# Patient Record
Sex: Female | Born: 1995 | Race: Black or African American | Hispanic: No | Marital: Single | Smoking: Never smoker
Health system: Southern US, Community
[De-identification: ages and names within clinical notes are randomized; demographics above are authoritative.]

---
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
## 2016-06-06 ENCOUNTER — Encounter: Payer: Self-pay | Admitting: Emergency Medicine

## 2016-06-06 DIAGNOSIS — F1210 Cannabis abuse, uncomplicated: Secondary | ICD-10-CM | POA: Insufficient documentation

## 2016-06-06 DIAGNOSIS — R51 Headache: Secondary | ICD-10-CM | POA: Insufficient documentation

## 2016-06-06 NOTE — ED Notes (Signed)
Pt presents to ED with possible migraine headache. +sensitivity to light. No hx of the same. Denies vomiting.

## 2016-06-07 ENCOUNTER — Emergency Department
Admission: EM | Admit: 2016-06-07 | Discharge: 2016-06-07 | Disposition: A | Discharge status: Home / Self Care | Payer: BLUE CROSS/BLUE SHIELD | Attending: Emergency Medicine | Admitting: Emergency Medicine

## 2016-06-07 DIAGNOSIS — R51 Headache: Secondary | ICD-10-CM | POA: Diagnosis not present

## 2016-06-07 DIAGNOSIS — R519 Headache, unspecified: Secondary | ICD-10-CM

## 2016-06-07 LAB — POCT PREGNANCY, URINE: Preg Test, Ur: NEGATIVE

## 2016-06-07 MED ORDER — OXYCODONE-ACETAMINOPHEN 5-325 MG PO TABS
1.0000 | ORAL_TABLET | Freq: Once | ORAL | Status: AC
  Administered 2016-06-07: 1 via ORAL

## 2016-06-07 MED ORDER — OXYCODONE-ACETAMINOPHEN 5-325 MG PO TABS
ORAL_TABLET | ORAL | Status: AC
  Administered 2016-06-07: 1 via ORAL
  Filled 2016-06-07: qty 1

## 2016-06-07 MED ORDER — AMOXICILLIN-POT CLAVULANATE 200-28.5 MG PO CHEW: 1.0000 | CHEWABLE_TABLET | Freq: Two times a day (BID) | ORAL | Status: AC

## 2016-06-07 NOTE — ED Provider Notes (Signed)
[facility] Emergency Department Provider Note  ____________________________________________  Time seen: 1:15 AM  I have reviewed the triage vital signs and the nursing notes.   HISTORY  Chief Complaint Headache     HPI Cynthia Sosa is a 20 y.o. female resents with frontal headache "possible migraine", accompanied by nausea but no vomiting 2 days. Patient denies any fever or neck stiffness. She does however admit to light sensitivity. Patient denies any weakness numbness gait instability or visual changes. Patient admits to a family history of migraine headaches sibling. Patient does admit to sinus congestion 2 days as well.     Past medical history None There are no active problems to display for this patient.   Past surgical history None  Current Outpatient Rx  Name  Route  Sig  Dispense  Refill  . amoxicillin-clavulanate (AUGMENTIN) 200-28.5 MG chewable tablet   Oral   Chew 1 tablet by mouth 2 (two) times daily.   20 tablet   0     Allergies No known drug allergies  No family history on file.  Social History Social History  Substance Use Topics  . Smoking status: Never Smoker   . Smokeless tobacco: Never Used  . Alcohol Use: No    Review of Systems  Constitutional: Negative for fever. Eyes: Negative for visual changes. ENT: Negative for sore throat. Cardiovascular: Negative for chest pain. Respiratory: Negative for shortness of breath. Gastrointestinal: Negative for abdominal pain, vomiting and diarrhea. Genitourinary: Negative for dysuria. Musculoskeletal: Negative for back pain. Skin: Negative for rash. Neurological: Positive for headache   10-point ROS otherwise negative.  ____________________________________________   PHYSICAL EXAM:  VITAL SIGNS: ED Triage Vitals  Enc Vitals Group     BP 06/06/16 2312 117/64 mmHg     Pulse Rate 06/06/16 2312 93     Resp 06/06/16 2312 18     Temp 06/06/16 2312 100.3 F  (37.9 C)     Temp Source 06/06/16 2312 Oral     SpO2 06/06/16 2312 100 %     Weight 06/06/16 2312 130 lb (58.968 kg)     Height 06/06/16 2312 5\' 7"  (1.702 m)     Head Cir --      Peak Flow --      Pain Score 06/06/16 2313 10     Pain Loc --      Pain Edu? --      Excl. in GC? --      Constitutional: Alert and oriented. Well appearing and in no distress. Eyes: Conjunctivae are normal. PERRL. Normal extraocular movements. ENT   Head: Normocephalic and atraumatic.   Nose: No congestion/rhinnorhea. Pain with palpation maxillary sinuses   Mouth/Throat: Mucous membranes are moist.   Neck: No stridor. Hematological/Lymphatic/Immunilogical: No cervical lymphadenopathy. Cardiovascular: Normal rate, regular rhythm. Normal and symmetric distal pulses are present in all extremities. No murmurs, rubs, or gallops. Respiratory: Normal respiratory effort without tachypnea nor retractions. Breath sounds are clear and equal bilaterally. No wheezes/rales/rhonchi. Gastrointestinal: Soft and nontender. No distention. There is no CVA tenderness. Genitourinary: deferred Musculoskeletal: Nontender with normal range of motion in all extremities. No joint effusions.  No lower extremity tenderness nor edema. Neurologic:  Normal speech and language. No gross focal neurologic deficits are appreciated. Speech is normal.  Skin:  Skin is warm, dry and intact. No rash noted. Psychiatric: Mood and affect are normal. Speech and behavior are normal. Patient exhibits appropriate insight and judgment.     INITIAL IMPRESSION / ASSESSMENT AND  PLAN / ED COURSE  Pertinent labs & imaging results that were available during my care of the patient were reviewed by me and considered in my medical decision making (see chart for details).  Patient given 1 tablet Percocet with resolution of pain. We'll prescribe Augmentin.  ____________________________________________   FINAL CLINICAL IMPRESSION(S) / ED  DIAGNOSES  Final diagnoses:  Sinus headache      Darci Currentandolph N Brown, MD 06/07/16 [phone]

## 2016-06-07 NOTE — ED Notes (Signed)
Brown, MD and Butch, RN at bedside at this time. 

## 2016-06-07 NOTE — ED Notes (Signed)
Pt reports headache for "a couple of days" - Pt reports nausea but denies vomiting - Is having light sensitivity - Denies blurred vision

## 2016-06-07 NOTE — Discharge Instructions (Signed)
Sinus Headache A sinus headache occurs when the paranasal sinuses become clogged or swollen. Paranasal sinuses are air pockets within the bones of the face. Sinus headaches can range from mild to severe. CAUSES A sinus headache can result from various conditions that affect the sinuses, such as:  Colds.  Sinus infections.  Allergies. SYMPTOMS The main symptom of this condition is a headache that may feel like pain or pressure in the face, forehead, ears, or upper teeth. People who have a sinus headache often have other symptoms, such as:  Congested or runny nose.  Fever.  Inability to smell. Weather changes can make symptoms worse. DIAGNOSIS This condition may be diagnosed based on:  A physical exam and medical history.  Imaging tests, such as a CT scan and MRI, to check for problems with the sinuses.  A specialist may look into the sinuses with a tool that has a camera (endoscopy). TREATMENT Treatment for this condition depends on the cause.  Sinus pain that is caused by a sinus infection may be treated with antibiotic medicine.  Sinus pain that is caused by allergies may be helped by allergy medicines (antihistamines) and medicated nasal sprays.  Sinus pain that is caused by congestion may be helped by flushing the nose and sinuses with saline solution. HOME CARE INSTRUCTIONS  Take medicines only as directed by your health care provider.  If you were prescribed an antibiotic medicine, finish all of it even if you start to feel better.  If you have congestion, use a nasal spray to help reduce pressure.  If directed, apply a warm, moist washcloth to your face to help relieve pain. SEEK MEDICAL CARE IF:  You have headaches more than one time each week.  You have sensitivity to light or sound.  You have a fever.  You feel sick to your stomach (nauseous) or you throw up (vomit).  Your headaches do not get better with treatment. Many people think that they have a  sinus headache when they actually have migraines or tension headaches. SEEK IMMEDIATE MEDICAL CARE IF:  You have vision problems.  You have sudden, severe pain in your face or head.  You have a seizure.  You are confused.  You have a stiff neck.   This information is not intended to replace advice given to you by your health care provider. Make sure you discuss any questions you have with your health care provider.   Document Released: 01/03/2005 Document Revised: 04/12/2015 Document Reviewed: 11/22/2014 Elsevier Interactive Patient Education 2016 [employer]  

## 2016-11-24 ENCOUNTER — Emergency Department: Payer: BLUE CROSS/BLUE SHIELD

## 2016-11-24 ENCOUNTER — Emergency Department
Admission: EM | Admit: 2016-11-24 | Discharge: 2016-11-24 | Disposition: A | Discharge status: Home / Self Care | Payer: BLUE CROSS/BLUE SHIELD | Attending: Emergency Medicine | Admitting: Emergency Medicine

## 2016-11-24 DIAGNOSIS — F1290 Cannabis use, unspecified, uncomplicated: Secondary | ICD-10-CM | POA: Insufficient documentation

## 2016-11-24 DIAGNOSIS — B373 Candidiasis of vulva and vagina: Secondary | ICD-10-CM | POA: Insufficient documentation

## 2016-11-24 DIAGNOSIS — N760 Acute vaginitis: Secondary | ICD-10-CM | POA: Diagnosis not present

## 2016-11-24 DIAGNOSIS — B9689 Other specified bacterial agents as the cause of diseases classified elsewhere: Secondary | ICD-10-CM

## 2016-11-24 DIAGNOSIS — Z79899 Other long term (current) drug therapy: Secondary | ICD-10-CM | POA: Insufficient documentation

## 2016-11-24 DIAGNOSIS — R109 Unspecified abdominal pain: Secondary | ICD-10-CM | POA: Diagnosis present

## 2016-11-24 DIAGNOSIS — B3731 Acute candidiasis of vulva and vagina: Secondary | ICD-10-CM

## 2016-11-24 LAB — BASIC METABOLIC PANEL
Anion gap: 3 — ABNORMAL LOW (ref 5–15)
BUN: 14 mg/dL (ref 6–20)
CALCIUM: 9 mg/dL (ref 8.9–10.3)
CO2: 27 mmol/L (ref 22–32)
CREATININE: 0.79 mg/dL (ref 0.44–1.00)
Chloride: 105 mmol/L (ref 101–111)
GFR calc Af Amer: >60 mL/min (ref >60)
GLUCOSE: 68 mg/dL (ref 65–99)
Potassium: 4 mmol/L (ref 3.5–5.1)
Sodium: 135 mmol/L (ref 135–145)

## 2016-11-24 LAB — URINALYSIS, COMPLETE (UACMP) WITH MICROSCOPIC
Bacteria, UA: NONE SEEN
Bilirubin Urine: NEGATIVE
GLUCOSE, UA: NEGATIVE mg/dL
Hgb urine dipstick: NEGATIVE
KETONES UR: NEGATIVE mg/dL
Nitrite: NEGATIVE
PH: 5 (ref 5.0–8.0)
Protein, ur: NEGATIVE mg/dL
SPECIFIC GRAVITY, URINE: 1.028 (ref 1.005–1.030)

## 2016-11-24 LAB — CBC
HCT: 36.3 % (ref 35.0–47.0)
Hemoglobin: 12.1 g/dL (ref 12.0–16.0)
MCH: 28.4 pg (ref 26.0–34.0)
MCHC: 33.2 g/dL (ref 32.0–36.0)
MCV: 85.5 fL (ref 80.0–100.0)
PLATELETS: 181 10*3/uL (ref 150–440)
RBC: 4.25 MIL/uL (ref 3.80–5.20)
RDW: 13.5 % (ref 11.5–14.5)
WBC: 6.5 10*3/uL (ref 3.6–11.0)

## 2016-11-24 LAB — CHLAMYDIA/NGC RT PCR (ARMC ONLY)
Chlamydia Tr: NOT DETECTED
N gonorrhoeae: NOT DETECTED

## 2016-11-24 LAB — WET PREP, GENITAL
SPERM: NONE SEEN
TRICH WET PREP: NONE SEEN

## 2016-11-24 LAB — POCT PREGNANCY, URINE: Preg Test, Ur: NEGATIVE

## 2016-11-24 MED ORDER — METRONIDAZOLE 500 MG PO TABS: 500.0000 mg | ORAL_TABLET | Freq: Two times a day (BID) | ORAL | 0 refills | Status: AC

## 2016-11-24 MED ORDER — FLUCONAZOLE 100 MG PO TABS
150.0000 mg | ORAL_TABLET | Freq: Once | ORAL | Status: AC
  Administered 2016-11-24: 150 mg via ORAL
  Filled 2016-11-24: qty 1

## 2016-11-24 MED ORDER — FLUCONAZOLE 150 MG PO TABS: 150.0000 mg | ORAL_TABLET | Freq: Once | ORAL | 0 refills | Status: DC | PRN

## 2016-11-24 MED ORDER — METRONIDAZOLE 500 MG PO TABS
500.0000 mg | ORAL_TABLET | Freq: Once | ORAL | Status: AC
  Administered 2016-11-24: 500 mg via ORAL
  Filled 2016-11-24: qty 1

## 2016-11-24 NOTE — ED Notes (Signed)
Pt verbalized understanding of discharge instructions. NAD at this time. 

## 2016-11-24 NOTE — ED Triage Notes (Signed)
Pt states that she was seen at school for having too much protein in her urine, pt states that she is having lower back pain, pt states that she has "white chunks in her urine" pt also states that she has irritation to her vaginal area, states recent diagnosis with chlamydia and wants to make sure she doesn't still have it

## 2016-11-24 NOTE — Discharge Instructions (Signed)
Take antibiotics as prescribed. You may repeat the dose of fluconazole after the antibiotics are done. Do not drink alcohol while taking Flagyl as it may cause an allergic reaction. Return to the emergency room if you have worsening left flank pain, urinary burning or pain, worsening vaginal discharge, fever, abdominal pain, or any other symptoms concerning to you.

## 2016-11-24 NOTE — ED Provider Notes (Signed)
[facility] Emergency Department Provider Note  ____________________________________________  Time seen: Approximately 12:57 PM  I have reviewed the triage vital signs and the nursing notes.   HISTORY  Chief Complaint Flank Pain   HPI Laquesha Lujean RaveM [place] is a 20 y.o. female no significant past medical history who presents for evaluation of left flank pain and vaginal discharge. Patient reports that she has had left-sided flank pain since October. The pain is throbbing, located in the left flank, nonradiating, mild, only present when she lays down. She has no pain at this time. She also has had vaginal discharge and irritation for about a week. She reports 2 episodes of chlamydia for which she was treated for with the last antibiotic course and October. She denies dysuria or hematuria, abdominal pain, fever or chills, history of kidney stones, chest pain or shortness of breath.  No past medical history on file.  There are no active problems to display for this patient.   No past surgical history on file.  Prior to Admission medications   Medication Sig Start Date End Date Taking? Authorizing Provider  cetirizine (ZYRTEC) 10 MG tablet Take 10 mg by mouth daily. 09/11/16  Yes Historical Provider, MD  PROAIR HFA 108 (90 Base) MCG/ACT inhaler Inhale 2 puffs into the lungs 4 (four) times daily as needed. 10/16/16  Yes Historical Provider, MD  fluconazole (DIFLUCAN) 150 MG tablet Take 1 tablet (150 mg total) by mouth once as needed (yeast infection). 11/24/16   Nita Sicklearolina Camaya Gannett, MD  metroNIDAZOLE (FLAGYL) 500 MG tablet Take 1 tablet (500 mg total) by mouth 2 (two) times daily. 11/24/16 12/01/16  Nita Sicklearolina Laray Corbit, MD    Allergies Patient has no known allergies.  No family history on file.  Social History Social History  Substance Use Topics  . Smoking status: Never Smoker  . Smokeless tobacco: Never Used  . Alcohol use No    Review of  Systems  Constitutional: Negative for fever. Eyes: Negative for visual changes. ENT: Negative for sore throat. Neck: No neck pain  Cardiovascular: Negative for chest pain. Respiratory: Negative for shortness of breath. Gastrointestinal: Negative for abdominal pain, vomiting or diarrhea. Genitourinary: Negative for dysuria. + vaginal discharge Musculoskeletal: Negative for back pain. + L flank pain Skin: Negative for rash. Neurological: Negative for headaches, weakness or numbness. Psych: No SI or HI  ____________________________________________   PHYSICAL EXAM:  VITAL SIGNS: ED Triage Vitals  Enc Vitals Group     BP 11/24/16 1034 96/71     Pulse Rate 11/24/16 1034 78     Resp 11/24/16 1034 18     Temp 11/24/16 1034 98.4 F (36.9 C)     Temp Source 11/24/16 1034 Oral     SpO2 11/24/16 1034 100 %     Weight 11/24/16 1037 135 lb (61.2 kg)     Height 11/24/16 1037 5\' 7"  (1.702 m)     Head Circumference --      Peak Flow --      Pain Score --      Pain Loc --      Pain Edu? --      Excl. in GC? --     Constitutional: Alert and oriented. Well appearing and in no apparent distress. HEENT:      Head: Normocephalic and atraumatic.         Eyes: Conjunctivae are normal. Sclera is non-icteric. EOMI. PERRL      Mouth/Throat: Mucous membranes are moist.  Neck: Supple with no signs of meningismus. Cardiovascular: Regular rate and rhythm. No murmurs, gallops, or rubs. 2+ symmetrical distal pulses are present in all extremities. No JVD. Respiratory: Normal respiratory effort. Lungs are clear to auscultation bilaterally. No wheezes, crackles, or rhonchi.  Gastrointestinal: Soft, non tender, and non distended with positive bowel sounds. No rebound or guarding. Genitourinary: No CVA tenderness. Pelvic exam: Normal external genitalia, no rashes or lesions. Normal cervical mucus. Os closed. No cervical motion tenderness.  No uterine or adnexal tenderness.   Musculoskeletal:  Nontender with normal range of motion in all extremities. No edema, cyanosis, or erythema of extremities. Neurologic: Normal speech and language. Face is symmetric. Moving all extremities. No gross focal neurologic deficits are appreciated. Skin: Skin is warm, dry and intact. No rash noted. Psychiatric: Mood and affect are normal. Speech and behavior are normal.  ____________________________________________   LABS (all labs ordered are listed, but only abnormal results are displayed)  Labs Reviewed  WET PREP, GENITAL - Abnormal; Notable for the following:       Result Value   Yeast Wet Prep HPF POC PRESENT (*)    Clue Cells Wet Prep HPF POC PRESENT (*)    WBC, Wet Prep HPF POC MODERATE (*)    All other components within normal limits  URINALYSIS, COMPLETE (UACMP) WITH MICROSCOPIC - Abnormal; Notable for the following:    Color, Urine YELLOW (*)    APPearance HAZY (*)    Leukocytes, UA TRACE (*)    Squamous Epithelial / LPF 6-30 (*)    All other components within normal limits  BASIC METABOLIC PANEL - Abnormal; Notable for the following:    Anion gap 3 (*)    All other components within normal limits  CHLAMYDIA/NGC RT PCR ([facility] ONLY)  CBC  POC URINE PREG, ED  POCT PREGNANCY, URINE   ____________________________________________  EKG  none ____________________________________________  RADIOLOGY  Renal US: No acute findings. No hydronephrosis. ____________________________________________   PROCEDURES  Procedure(s) performed: None Procedures Critical Care performed:  None ____________________________________________   INITIAL IMPRESSION / ASSESSMENT AND PLAN / ED COURSE  20 y.o. female no significant past medical history who presents for evaluation of left flank pain and vaginal discharge. Patient is well-appearing, in no distress, pelvic exam and no acute findings. No flank tenderness, abdominal exam with no tenderness. UA with no evidence of urinary tract  infection. Wet prep, gonorrhea and chlamydia are pending. Pregnancy test is negative. We'll send patient for a left renal ultrasound to evaluate for any evidence of hydronephrosis. Safe sex counseling provided. Patient has IUD Mirena for birth control.   Clinical Course as of Nov 24 1412  Sat Nov 24, 2016  1409 Patient found to have BV and yeast infection. She received a dose of fluconazole and will be discharged on Flagyl with a repeat dose of fluconazole if her symptoms recur after antibiotics. CBC, BMP, and renal ultrasound all within normal limits with no evidence of urinary tract infection. Patient be discharged home with close follow-up with PCP.  [CV]    Clinical Course User Index [CV] Nita Sicklearolina Kiyla Ringler, MD    Pertinent labs & imaging results that were available during my care of the patient were reviewed by me and considered in my medical decision making (see chart for details).    ____________________________________________   FINAL CLINICAL IMPRESSION(S) / ED DIAGNOSES  Final diagnoses:  Left flank pain  BV (bacterial vaginosis)  Yeast infection of the vagina      NEW MEDICATIONS STARTED  DURING THIS VISIT:  New Prescriptions   FLUCONAZOLE (DIFLUCAN) 150 MG TABLET    Take 1 tablet (150 mg total) by mouth once as needed (yeast infection).   METRONIDAZOLE (FLAGYL) 500 MG TABLET    Take 1 tablet (500 mg total) by mouth 2 (two) times daily.     Note:  This document was prepared using Dragon voice recognition software and may include unintentional dictation errors.    Nita Sickle, MD 11/24/16 [phone]

## 2017-09-06 HISTORY — PX: ACHILLES TENDON SURGERY: SHX542

## 2017-10-19 ENCOUNTER — Emergency Department
Admission: EM | Admit: 2017-10-19 | Discharge: 2017-10-19 | Disposition: A | Discharge status: Home / Self Care | Payer: BLUE CROSS/BLUE SHIELD | Attending: Emergency Medicine | Admitting: Emergency Medicine

## 2017-10-19 DIAGNOSIS — Z79899 Other long term (current) drug therapy: Secondary | ICD-10-CM | POA: Insufficient documentation

## 2017-10-19 DIAGNOSIS — M79672 Pain in left foot: Secondary | ICD-10-CM | POA: Diagnosis present

## 2017-10-19 NOTE — Discharge Instructions (Signed)
There is no evidence that you have caused a new or severe injury to your operative site.  Please continue caring for your leg and foot as you were instructed by your surgeon.  Keep a thin coating of bacitracin or other antibiotic ointment on the wound where it is oozing or bleeding slightly and cover it with a bandage or Band-Aid.  Call your surgeon to arrange a follow up appointment.  Return to the Emergency Department if you develop new or worsening symptoms that concern you.

## 2017-10-19 NOTE — ED Provider Notes (Signed)
[facility] Emergency Department Provider Note  ____________________________________________   First MD Initiated Contact with Patient 10/19/17 [phone]     (approximate)  I have reviewed the triage vital signs and the nursing notes.   HISTORY  Chief Complaint Post-op Problem (left foot)    HPI Cynthia Sosa is a 21 y.o. female who had left Achilles tendon surgery with a surgeon in [place] approximately 6 weeks ago.  She presents tonight because she is supposed to be nonweightbearing but she forgot and swelling her leg over the bed and put some weight on the foot before she remembered.  She reports that she did not feel a pop but she did have some pain.  Her stitches have been removed and the scabs just recently fell off and there was a little bit of bleeding at the operative site which scared her.  She felt like she should be evaluated to make sure there is no new injury.  She denies any swelling in the area, denies numbness and tingling, and has no pain increased over baseline.  Her onset of the incident was acute and the symptoms were mild.  History reviewed. No pertinent past medical history.  There are no active problems to display for this patient.   Past Surgical History:  Procedure Laterality Date  . ACHILLES TENDON SURGERY Left 09/06/2017    Prior to Admission medications   Medication Sig Start Date End Date Taking? Authorizing Provider  cetirizine (ZYRTEC) 10 MG tablet Take 10 mg by mouth daily. 09/11/16   [provider]  fluconazole (DIFLUCAN) 150 MG tablet Take 1 tablet (150 mg total) by mouth once as needed (yeast infection). 11/24/16   Nita SickleVeronese, [place], MD  PROAIR HFA 108 [phone](90 Base) MCG/ACT inhaler Inhale 2 puffs into the lungs 4 (four) times daily as needed. 10/16/16   [provider]    Allergies Patient has no known allergies.  No family history on file.  Social History Social History   Tobacco Use  . Smoking  status: Never Smoker  . Smokeless tobacco: Never Used  Substance Use Topics  . Alcohol use: No  . Drug use: Yes    Types: Marijuana    Review of Systems Constitutional: No fever/chills Cardiovascular: Denies chest pain. Respiratory: Denies shortness of breath. Gastrointestinal: No nausea, no vomiting.   Musculoskeletal: Some pain initially with weightbearing of the left foot.  No swelling. Integumentary: Negative for rash.  Normal bleeding at the operative site Neurological: Negative for headaches, focal weakness or numbness.   ____________________________________________   PHYSICAL EXAM:  VITAL SIGNS: ED Triage Vitals  Enc Vitals Group     BP 10/19/17 0223 132/74     Pulse Rate 10/19/17 0223 88     Resp 10/19/17 0223 16     Temp 10/19/17 0223 98.6 F (37 C)     Temp Source 10/19/17 0223 Oral     SpO2 10/19/17 0223 99 %     Weight 10/19/17 0223 60.3 kg (133 lb)     Height 10/19/17 0223 1.727 m (5\' 8" )     Head Circumference --      Peak Flow --      Pain Score 10/19/17 0232 10     Pain Loc --      Pain Edu? --      Excl. in GC? --     Constitutional: Alert and oriented. Well appearing and in no acute distress. Eyes: Conjunctivae are normal.  Head: Atraumatic. Cardiovascular: Normal rate,  regular rhythm. Good peripheral circulation.  Respiratory: Normal respiratory effort.  No retractions.  Musculoskeletal: No lower extremity edema. No gross deformities of extremities.  The patient has no swelling, hematoma, nor ecchymosis of the left lower leg.  She has some tenderness that she says is normal and distal leg.  There is a very small area covered with a Band-Aid that is the operative site of the Achilles tendon surgery that is oozing some blood, but there is no evidence of infection or clinically significant wound dehiscence Neurologic:  Normal speech and language. No gross focal neurologic deficits are appreciated.  Skin:  Skin is warm, dry and intact. No rash  noted. Psychiatric: Mood and affect are normal. Speech and behavior are normal.  ____________________________________________   LABS (all labs ordered are listed, but only abnormal results are displayed)  Labs Reviewed - No data to display ____________________________________________  EKG  None - EKG not ordered by ED physician ____________________________________________  RADIOLOGY   No results found.  ____________________________________________   PROCEDURES  Critical Care performed: No   Procedure(s) performed:   Procedures   ____________________________________________   INITIAL IMPRESSION / ASSESSMENT AND PLAN / ED COURSE  As part of my medical decision making, I reviewed the following data within the electronic MEDICAL RECORD NUMBER History obtained from family and Nursing notes reviewed and incorporated    The patient's evaluation is reassuring.  Differential diagnosis includes reinjury to her operative site, wound dehiscence, wound infection, internal bleeding/hematoma, neurovascular compromise, etc.  However the site is well-appearing with only a small amount of blood oozing from the operative site that recently lost its scab.  There is no evidence of any infection and there is no swelling, tenderness or ecchymosis to suggest internal hematoma/hemorrhage.  The patient is in no distress.  I provided reassurance and encouraged her to continue using the same precautions she is previously and call her surgeon for a follow-up appointment, but explained there is no evidence of acute or emergent injury at this time.  She and her mother are comfortable with the plan for outpatient follow-up.     ____________________________________________  FINAL CLINICAL IMPRESSION(S) / ED DIAGNOSES  Final diagnoses:  Left foot pain     MEDICATIONS GIVEN DURING THIS VISIT:  Medications - No data to display   ED Discharge Orders    None       Note:  This document was  prepared using Dragon voice recognition software and may include unintentional dictation errors.    Loleta RoseForbach, Delanee Xin, MD 10/19/17 [phone]

## 2017-10-19 NOTE — ED Triage Notes (Signed)
Patient had achilles tendon surgery left foot on 9/28. Patient not supposed to bear weight on foot until end of November. Patient stepped down on wrong foot (left) and tore stitches.  Bleeding noted to area in triage. Patient c/o 10 out of 10 pain

## 2017-10-19 NOTE — ED Notes (Signed)
Pt with open surgical incision noted to left lateral ankle. Pt with controlled bleeding.

## 2018-05-16 ENCOUNTER — Ambulatory Visit: Payer: Managed Care, Other (non HMO) | Admitting: Anesthesiology

## 2018-05-16 ENCOUNTER — Encounter: Payer: Self-pay | Admitting: *Deleted

## 2018-05-16 ENCOUNTER — Encounter
Admission: RE | Disposition: A | Discharge status: Home / Self Care | Payer: Self-pay | Source: Ambulatory Visit | Attending: Obstetrics & Gynecology

## 2018-05-16 ENCOUNTER — Ambulatory Visit
Admission: RE | Admit: 2018-05-16 | Discharge: 2018-05-16 | Disposition: A | Discharge status: Home / Self Care | Payer: Managed Care, Other (non HMO) | Source: Ambulatory Visit | Attending: Obstetrics & Gynecology | Admitting: Obstetrics & Gynecology

## 2018-05-16 ENCOUNTER — Other Ambulatory Visit: Payer: Self-pay

## 2018-05-16 DIAGNOSIS — Y929 Unspecified place or not applicable: Secondary | ICD-10-CM | POA: Diagnosis not present

## 2018-05-16 DIAGNOSIS — T8332XA Displacement of intrauterine contraceptive device, initial encounter: Secondary | ICD-10-CM | POA: Diagnosis present

## 2018-05-16 DIAGNOSIS — Y848 Other medical procedures as the cause of abnormal reaction of the patient, or of later complication, without mention of misadventure at the time of the procedure: Secondary | ICD-10-CM | POA: Insufficient documentation

## 2018-05-16 DIAGNOSIS — T839XXA Unspecified complication of genitourinary prosthetic device, implant and graft, initial encounter: Secondary | ICD-10-CM

## 2018-05-16 HISTORY — PX: IUD REMOVAL: SHX5392

## 2018-05-16 HISTORY — PX: HYSTEROSCOPY: SHX211

## 2018-05-16 LAB — URINE DRUG SCREEN, QUALITATIVE (ARMC ONLY)
AMPHETAMINES, UR SCREEN: NOT DETECTED
BARBITURATES, UR SCREEN: NOT DETECTED
Benzodiazepine, Ur Scrn: NOT DETECTED
Cannabinoid 50 Ng, Ur ~~LOC~~: POSITIVE — AB
Cocaine Metabolite,Ur ~~LOC~~: NOT DETECTED
MDMA (Ecstasy)Ur Screen: NOT DETECTED
Methadone Scn, Ur: NOT DETECTED
Opiate, Ur Screen: NOT DETECTED
Phencyclidine (PCP) Ur S: NOT DETECTED
Tricyclic, Ur Screen: NOT DETECTED

## 2018-05-16 LAB — BASIC METABOLIC PANEL
Anion gap: 7 (ref 5–15)
BUN: 12 mg/dL (ref 6–20)
CALCIUM: 9 mg/dL (ref 8.9–10.3)
CO2: 24 mmol/L (ref 22–32)
CREATININE: 0.8 mg/dL (ref 0.44–1.00)
Chloride: 107 mmol/L (ref 101–111)
GFR calc Af Amer: >60 mL/min (ref >60)
GLUCOSE: 85 mg/dL (ref 65–99)
POTASSIUM: 3.8 mmol/L (ref 3.5–5.1)
SODIUM: 138 mmol/L (ref 135–145)

## 2018-05-16 LAB — CBC
HCT: 36.3 % (ref 35.0–47.0)
Hemoglobin: 12 g/dL (ref 12.0–16.0)
MCH: 28.9 pg (ref 26.0–34.0)
MCHC: 33.1 g/dL (ref 32.0–36.0)
MCV: 87.3 fL (ref 80.0–100.0)
PLATELETS: 257 10*3/uL (ref 150–440)
RBC: 4.15 MIL/uL (ref 3.80–5.20)
RDW: 13.6 % (ref 11.5–14.5)
WBC: 6.7 10*3/uL (ref 3.6–11.0)

## 2018-05-16 LAB — POCT PREGNANCY, URINE: PREG TEST UR: NEGATIVE

## 2018-05-16 SURGERY — HYSTEROSCOPY
Anesthesia: Choice

## 2018-05-16 SURGERY — HYSTEROSCOPY
Anesthesia: General | Site: Uterus | Wound class: Clean Contaminated

## 2018-05-16 MED ORDER — PROPOFOL 10 MG/ML IV BOLUS
INTRAVENOUS | Status: DC | PRN
  Administered 2018-05-16: 150 mg via INTRAVENOUS

## 2018-05-16 MED ORDER — MIDAZOLAM HCL 2 MG/2ML IJ SOLN
INTRAMUSCULAR | Status: AC
  Filled 2018-05-16: qty 2

## 2018-05-16 MED ORDER — PROPOFOL 10 MG/ML IV BOLUS
INTRAVENOUS | Status: AC
  Filled 2018-05-16: qty 20

## 2018-05-16 MED ORDER — MIDAZOLAM HCL 2 MG/2ML IJ SOLN
INTRAMUSCULAR | Status: DC | PRN
  Administered 2018-05-16: 2 mg via INTRAVENOUS

## 2018-05-16 MED ORDER — FENTANYL CITRATE (PF) 100 MCG/2ML IJ SOLN
INTRAMUSCULAR | Status: DC | PRN
  Administered 2018-05-16: 50 ug via INTRAVENOUS

## 2018-05-16 MED ORDER — LIDOCAINE HCL (CARDIAC) PF 100 MG/5ML IV SOSY
PREFILLED_SYRINGE | INTRAVENOUS | Status: DC | PRN
  Administered 2018-05-16: 100 mg via INTRAVENOUS

## 2018-05-16 MED ORDER — SILVER NITRATE-POT NITRATE 75-25 % EX MISC
CUTANEOUS | Status: DC | PRN
  Administered 2018-05-16: 1

## 2018-05-16 MED ORDER — GLYCOPYRROLATE 0.2 MG/ML IJ SOLN
INTRAMUSCULAR | Status: DC | PRN
  Administered 2018-05-16: 0.2 mg via INTRAVENOUS

## 2018-05-16 MED ORDER — ONDANSETRON HCL 4 MG/2ML IJ SOLN
INTRAMUSCULAR | Status: DC | PRN
  Administered 2018-05-16: 4 mg via INTRAVENOUS

## 2018-05-16 MED ORDER — DEXAMETHASONE SODIUM PHOSPHATE 10 MG/ML IJ SOLN
INTRAMUSCULAR | Status: DC | PRN
  Administered 2018-05-16: 10 mg via INTRAVENOUS

## 2018-05-16 MED ORDER — FENTANYL CITRATE (PF) 100 MCG/2ML IJ SOLN
INTRAMUSCULAR | Status: AC
  Filled 2018-05-16: qty 2

## 2018-05-16 MED ORDER — LACTATED RINGERS IV SOLN
INTRAVENOUS | Status: DC
  Administered 2018-05-16: 14:00:00 via INTRAVENOUS

## 2018-05-16 SURGICAL SUPPLY — 18 items
CATH ROBINSON RED A/P 16FR (CATHETERS) ×3 IMPLANT
ELECT REM PT RETURN 9FT ADLT (ELECTROSURGICAL) ×3
ELECTRODE REM PT RTRN 9FT ADLT (ELECTROSURGICAL) ×1 IMPLANT
GLOVE PI ORTHOPRO 6.5 (GLOVE) ×2
GLOVE PI ORTHOPRO STRL 6.5 (GLOVE) ×1 IMPLANT
GLOVE SURG SYN 6.5 ES PF (GLOVE) ×3 IMPLANT
GOWN STRL REUS W/ TWL LRG LVL3 (GOWN DISPOSABLE) ×2 IMPLANT
GOWN STRL REUS W/TWL LRG LVL3 (GOWN DISPOSABLE) ×4
IV LACTATED RINGERS 1000ML (IV SOLUTION) ×3 IMPLANT
KIT TURNOVER CYSTO (KITS) ×3 IMPLANT
NEEDLE SPNL 22GX3.5 QUINCKE BK (NEEDLE) IMPLANT
PACK DNC HYST (MISCELLANEOUS) ×3 IMPLANT
PAD OB MATERNITY 4.3X12.25 (PERSONAL CARE ITEMS) ×3 IMPLANT
PAD PREP 24X41 OB/GYN DISP (PERSONAL CARE ITEMS) ×3 IMPLANT
SYR 10ML LL (SYRINGE) ×3 IMPLANT
TOWEL OR 17X26 4PK STRL BLUE (TOWEL DISPOSABLE) ×3 IMPLANT
TUBING CONNECTING 10 (TUBING) ×2 IMPLANT
TUBING CONNECTING 10' (TUBING) ×1

## 2018-05-16 NOTE — Discharge Instructions (Signed)
You should expect to have some cramping and vaginal bleeding for about a week. This should taper off and subside, much like a period. If heavy bleeding continues or gets worse, you should contact the office for an earlier appointment.  ° °Please call the office or physician on call for fever >101, severe pain, and heavy bleeding.   [phone] ° °NOTHING IN THE VAGINA FOR 2 WEEKS!! ° °Dr. Crosby Bevan will discuss pathology results with you at your postop visit. ° ° °

## 2018-05-16 NOTE — Transfer of Care (Signed)
Immediate Anesthesia Transfer of Care Note  Patient: Cynthia Sosa  Procedure(s) Performed: HYSTEROSCOPY (N/A Uterus) INTRAUTERINE DEVICE (IUD) REMOVAL (N/A Uterus)  Patient Location: PACU  Anesthesia Type:General  Level of Consciousness: awake and sedated  Airway & Oxygen Therapy: Patient Spontanous Breathing and Patient connected to face mask oxygen  Post-op Assessment: Report given to RN and Post -op Vital signs reviewed and stable  Post vital signs: Reviewed and stable  Last Vitals:  Vitals Value Taken Time  BP    Temp    Pulse 114 05/16/2018  3:08 PM  Resp 22 05/16/2018  3:08 PM  SpO2 100 % 05/16/2018  3:08 PM  Vitals shown include unvalidated device data.  Last Pain:  Vitals:   05/16/18 1351  TempSrc: Oral         Complications: No apparent anesthesia complications

## 2018-05-16 NOTE — Anesthesia Procedure Notes (Signed)
Procedure Name: LMA Insertion Date/Time: 05/16/2018 2:42 PM Performed by: Junious SilkNoles, Lauryl Seyer, CRNA Pre-anesthesia Checklist: Patient identified, Patient being monitored, Timeout performed, Emergency Drugs available and Suction available Patient Re-evaluated:Patient Re-evaluated prior to induction Oxygen Delivery Method: Circle system utilized Preoxygenation: Pre-oxygenation with 100% oxygen Induction Type: IV induction Ventilation: Mask ventilation without difficulty LMA: LMA inserted LMA Size: 4.0 Tube type: Oral Number of attempts: 1 Placement Confirmation: positive ETCO2 and breath sounds checked- equal and bilateral Tube secured with: Tape Dental Injury: Teeth and Oropharynx as per pre-operative assessment

## 2018-05-16 NOTE — Anesthesia Preprocedure Evaluation (Signed)
Anesthesia Evaluation  Patient identified by MRN, date of birth, ID band Patient awake    Reviewed: Allergy & Precautions, H&P , NPO status , reviewed documented beta blocker date and time   Airway Mallampati: II  TM Distance: >3 FB Neck ROM: full    Dental  (+) Teeth Intact   Pulmonary neg pulmonary ROS,    Pulmonary exam normal        Cardiovascular negative cardio ROS Normal cardiovascular exam     Neuro/Psych negative neurological ROS  negative psych ROS   GI/Hepatic negative GI ROS, Neg liver ROS,   Endo/Other  negative endocrine ROS  Renal/GU      Musculoskeletal   Abdominal   Peds  Hematology negative hematology ROS (+)   Anesthesia Other Findings History reviewed. No pertinent past medical history other than THC use.  Past Surgical History: 09/06/2017: ACHILLES TENDON SURGERY; Left  BMI    Body Mass Index:  21.29 kg/m   UDS +THC, otherwise -     Reproductive/Obstetrics                             Anesthesia Physical Anesthesia Plan  ASA: II  Anesthesia Plan: General   Post-op Pain Management:    Induction:   PONV Risk Score and Plan: 4 or greater and Treatment may vary due to age or medical condition, Ondansetron, Midazolam and Metaclopromide  Airway Management Planned:   Additional Equipment:   Intra-op Plan:   Post-operative Plan:   Informed Consent: I have reviewed the patients History and Physical, chart, labs and discussed the procedure including the risks, benefits and alternatives for the proposed anesthesia with the patient or authorized representative who has indicated his/her understanding and acceptance.   Dental Advisory Given  Plan Discussed with: CRNA  Anesthesia Plan Comments:         Anesthesia Quick Evaluation

## 2018-05-16 NOTE — Anesthesia Post-op Follow-up Note (Signed)
Anesthesia QCDR form completed.        

## 2018-05-16 NOTE — H&P (Signed)
Preoperative History and Physical  Cynthia Sosa is a 22 y.o. here for surgical management of embedded IUD.   No significant preoperative concerns.  Proposed surgery: hysteroscopy, and removal of IUD.   Chanteria was seen in clinic, with two attempts to remove IUD, one via ultrasound guidance.  IUD was visualized in proper place in fundus, not beyond myometrium, but could not retreive it.  History reviewed. No pertinent past medical history. Past Surgical History:  Procedure Laterality Date  . ACHILLES TENDON SURGERY Left 09/06/2017   OB History  No data available  Patient denies any other pertinent gynecologic issues.   No current facility-administered medications on file prior to encounter.    Current Outpatient Medications on File Prior to Encounter  Medication Sig Dispense Refill  . cetirizine (ZYRTEC) 10 MG tablet Take 10 mg by mouth daily.  11  . fluconazole (DIFLUCAN) 150 MG tablet Take 1 tablet (150 mg total) by mouth once as needed (yeast infection). (Patient not taking: Reported on 05/16/2018) 1 tablet 0  . PROAIR HFA 108 (90 Base) MCG/ACT inhaler Inhale 2 puffs into the lungs 4 (four) times daily as needed.  0   No Known Allergies  Social History:   reports that she has never smoked. She has never used smokeless tobacco. She reports that she has current or past drug history. Drug: Marijuana. She reports that she does not drink alcohol.  History reviewed. No pertinent family history.  Review of Systems: Noncontributory  PHYSICAL EXAM: Blood pressure (!) 153/84, pulse 86, temperature 98.3 F (36.8 C), temperature source Oral, resp. rate 16, height 5\' 8"  (1.727 m), weight 63.5 kg (140 lb), SpO2 100 %. General appearance - alert, well appearing, and in no distress Chest - clear to auscultation, no wheezes, rales or rhonchi, symmetric air entry Heart - normal rate and regular rhythm Abdomen - soft, nontender, nondistended, no masses or organomegaly Pelvic - examination not  indicated Extremities - peripheral pulses normal, no pedal edema, no clubbing or cyanosis  Labs: Results for orders placed or performed during the hospital encounter of 05/16/18 (from the past 336 hour(s))  CBC   Collection Time: 05/16/18  1:03 PM  Result Value Ref Range   WBC 6.7 3.6 - 11.0 K/uL   RBC 4.15 3.80 - 5.20 MIL/uL   Hemoglobin 12.0 12.0 - 16.0 g/dL   HCT 16.1 09.6 - 04.5 %   MCV 87.3 80.0 - 100.0 fL   MCH 28.9 26.0 - 34.0 pg   MCHC 33.1 32.0 - 36.0 g/dL   RDW 40.9 81.1 - 91.4 %   Platelets 257 150 - 440 K/uL  Basic metabolic panel   Collection Time: 05/16/18  1:03 PM  Result Value Ref Range   Sodium 138 135 - 145 mmol/L   Potassium 3.8 3.5 - 5.1 mmol/L   Chloride 107 101 - 111 mmol/L   CO2 24 22 - 32 mmol/L   Glucose, Bld 85 65 - 99 mg/dL   BUN 12 6 - 20 mg/dL   Creatinine, Ser 7.82 0.44 - 1.00 mg/dL   Calcium 9.0 8.9 - 95.6 mg/dL   GFR calc non Af Amer >60 >60 mL/min   GFR calc Af Amer >60 >60 mL/min   Anion gap 7 5 - 15  Type and screen [facility]   Collection Time: 05/16/18  1:03 PM  Result Value Ref Range   ABO/RH(D) PENDING    Antibody Screen PENDING    Sample Expiration  05/19/2018 Performed at [facility], [address] [place], [place] [address]     Assessment: Patient Active Problem List   Diagnosis Date Noted  . IUD complication (HCC) 05/16/2018    Plan: Patient will undergo surgical management with hysteroscopy, and IUD removal.   The risks of surgery were discussed in detail with the patient including but not limited to: bleeding which may require transfusion or reoperation; infection which may require antibiotics; injury to surrounding organs which may involve bowel, bladder, ureters ; need for additional procedures including laparoscopy or laparotomy; thromboembolic phenomenon, surgical site problems and other postoperative/anesthesia complications. Likelihood of success in alleviating the patient's  condition was discussed. Routine postoperative instructions will be reviewed with the patient and her family in detail after surgery.  The patient concurred with the proposed plan, giving informed written consent for the surgery.  Patient has been NPO since last night she will remain NPO for procedure.  Anesthesia and OR aware.  To OR when ready.  ----- Ranae Plumberhelsea Ward, MD Attending Obstetrician and Gynecologist [facility], Department of OB/GYN [facility]  05/16/2018 2:10 PM

## 2018-05-16 NOTE — Op Note (Signed)
Operative Report  05/16/2018  Patient:  Cynthia Sosa  22 y.o. female Preoperative diagnosis:  retained IUD Postoperative diagnosis:  retained IUD  PROCEDURE:  Procedure(s): HYSTEROSCOPY (N/A) INTRAUTERINE DEVICE (IUD) REMOVAL (N/A) Surgeon:  Surgeon(s) and Role:    * Ward, Elenora Fenderhelsea C, MD - Primary Anesthesia:  LMA I/O: Total I/O In: 500 [I.V.:500] Out: - 10cc EBL Specimens:  None Complications: None Apparent Disposition:  VS stable to PACU  Findings: Uterus, mobile, normal size, normal cervix, vagina, perineum. IUD seen in fundus of uterus, with strings tucked up to fundus.  Indication for procedure/Consents: 22 y.o.  here for scheduled surgery for the aforementioned diagnoses.  Risks of surgery were discussed with the patient including but not limited to: bleeding which may require transfusion; infection which may require antibiotics; injury to uterus or surrounding organs; intrauterine scarring which may impair future fertility; need for additional procedures including laparotomy or laparoscopy; and other postoperative/anesthesia complications. Written informed consent was obtained.    Procedure Details:   The patient was then taken to the operating room where anesthesia was administered  After a formal timeout was performed, she was placed in the dorsal lithotomy position and examined with the above findings. She was then prepped and draped in the sterile manner.  A speculum was then placed in the patient's vagina and a single tooth tenaculum was applied to the anterior lip of the cervix.  The cervix was serially dilated to accommodate the hysteroscope, with findings as above. Long forceps were placed in the cervix to the fundus, the IUD was palpated and grasped, and retracted from the uterus without difficulty. The tenaculum was removed from the anterior lip of the cervix, silver nitrate applied, and the vaginal speculum was removed after noting good hemostasis. The patient tolerated  the procedure well and was taken to the recovery area awake, extubated and in stable condition.  The patient will be discharged to home as per PACU criteria.  Routine postoperative instructions given. She will follow up in the clinic in two to four weeks for postoperative evaluation.  Ranae Plumberhelsea Ward, MD [facility] O[facility]YN Attending Gynecologist

## 2018-05-17 LAB — TYPE AND SCREEN
ABO/RH(D): O POS
ANTIBODY SCREEN: NEGATIVE

## 2018-05-19 ENCOUNTER — Encounter: Payer: Self-pay | Admitting: Obstetrics & Gynecology

## 2018-05-20 NOTE — Anesthesia Postprocedure Evaluation (Signed)
Anesthesia Post Note  Patient: Jahdai Lujean RaveM Mccrumb  Procedure(s) Performed: HYSTEROSCOPY (N/A Uterus) INTRAUTERINE DEVICE (IUD) REMOVAL (N/A Uterus)  Patient location during evaluation: PACU Anesthesia Type: General Level of consciousness: awake and alert Pain management: pain level controlled Vital Signs Assessment: post-procedure vital signs reviewed and stable Respiratory status: spontaneous breathing, nonlabored ventilation and respiratory function stable Cardiovascular status: blood pressure returned to baseline and stable Postop Assessment: no apparent nausea or vomiting Anesthetic complications: no     Last Vitals:  Vitals:   05/16/18 1603 05/16/18 1631  BP: (!) 148/73 124/77  Pulse: 62 69  Resp: 16   Temp: (!) 35.9 C   SpO2: 100% 100%    Last Pain:  Vitals:   05/19/18 0921  TempSrc:   PainSc: 0-No pain                 Christia ReadingScott T Lorelee Mclaurin

## 2018-08-22 ENCOUNTER — Emergency Department: Payer: Managed Care, Other (non HMO)

## 2018-08-22 ENCOUNTER — Emergency Department
Admission: EM | Admit: 2018-08-22 | Discharge: 2018-08-22 | Disposition: A | Discharge status: Home / Self Care | Payer: Managed Care, Other (non HMO) | Attending: Emergency Medicine | Admitting: Emergency Medicine

## 2018-08-22 ENCOUNTER — Encounter: Payer: Self-pay | Admitting: Emergency Medicine

## 2018-08-22 ENCOUNTER — Other Ambulatory Visit: Payer: Self-pay

## 2018-08-22 DIAGNOSIS — R42 Dizziness and giddiness: Secondary | ICD-10-CM | POA: Insufficient documentation

## 2018-08-22 DIAGNOSIS — Z79899 Other long term (current) drug therapy: Secondary | ICD-10-CM | POA: Diagnosis not present

## 2018-08-22 DIAGNOSIS — R519 Headache, unspecified: Secondary | ICD-10-CM

## 2018-08-22 DIAGNOSIS — R51 Headache: Secondary | ICD-10-CM | POA: Insufficient documentation

## 2018-08-22 NOTE — ED Provider Notes (Signed)
ED ECG REPORT I, Loleta Roseory Winthrop Shannahan, the attending physician, personally viewed and interpreted this ECG.  Date: 08/22/2018 EKG Time: 12:50 PM Rate: 77 Rhythm: normal sinus rhythm QRS Axis: normal Intervals: normal ST/T Wave abnormalities: Non-specific ST segment / T-wave changes, but no evidence of acute ischemia. Narrative Interpretation: no evidence of acute ischemia     Loleta RoseForbach, Jelene Albano, MD 08/22/18 1253

## 2018-08-22 NOTE — Discharge Instructions (Signed)
Please follow up with your primary care provider for symptoms that change or worsen. Follow up with the neurologist as scheduled. Return to the ER for concerns if unable to see primary care or the neurologist.

## 2018-08-22 NOTE — ED Triage Notes (Signed)
Presents with headache   States she fell and hit her head last week  Was dx'd with a concussion and just went back to work today  States she had a short episode of numbness to both arms  Which is now gone   And also had a few sec episode of dizziness  States she is not dizzy at present  But has headache to both temporal areas

## 2018-08-22 NOTE — ED Provider Notes (Signed)
[facility] Emergency Department Provider Note ____________________________________________  Time seen: Approximately 12:40 PM  I have reviewed the triage vital signs and the nursing notes.   HISTORY  Chief Complaint Headache   HPI Cynthia Sosa is a 22 y.o. female who presents to the emergency department for treatment and evaluation of headache and dizziness. While at work last week, she passed out and hit the floor. Incident was witnessed by her coworker. She states that she went home and rested, thinking she probably had a concussion. She has been off work all week. Intermittently she has had dizziness and headaches. She has taken ibuprofen twice over the week. She went back to work today and started to feel dizzy again and headache returned. She denies syncope today. She is concerned that the original occurrence was a seizure, but denies any history of seizures. She has an appointment scheduled with a neurologist on October 11, but decided to come in today. At this time, dizziness has resolved but headache remains. Last dose of ibuprofen was sometime yesterday.  Location: temporal bilateral Similar to previous headaches: no Duration: intermittent with various length TIMING:random over the last week SEVERITY:4/10 QUALITY:throb CONTEXT:post syncope MODIFYING FACTORS: none ASSOCIATED SYMPTOMS: dizziness History reviewed. No pertinent past medical history.  Patient Active Problem List   Diagnosis Date Noted  . IUD complication (HCC) 05/16/2018    Past Surgical History:  Procedure Laterality Date  . ACHILLES TENDON SURGERY Left 09/06/2017  . HYSTEROSCOPY N/A 05/16/2018   Procedure: HYSTEROSCOPY;  Surgeon: Ward, Elenora Fender, MD;  Location: [facility] ORS;  Service: Gynecology;  Laterality: N/A;  . IUD REMOVAL N/A 05/16/2018   Procedure: INTRAUTERINE DEVICE (IUD) REMOVAL;  Surgeon: Ward, Elenora Fender, MD;  Location: [facility] ORS;  Service: Gynecology;  Laterality: N/A;     Prior to Admission medications   Medication Sig Start Date End Date Taking? Authorizing Provider  cetirizine (ZYRTEC) 10 MG tablet Take 10 mg by mouth daily. 09/11/16   [provider]  fluconazole (DIFLUCAN) 150 MG tablet Take 1 tablet (150 mg total) by mouth once as needed (yeast infection). Patient not taking: Reported on 05/16/2018 11/24/16   Nita Sickle, MD  [facility] HFA 108 [phone] Base) MCG/ACT inhaler Inhale 2 puffs into the lungs 4 (four) times daily as needed. 10/16/16   [provider]    Allergies Patient has no known allergies.  No family history on file.  Social History Social History   Tobacco Use  . Smoking status: Never Smoker  . Smokeless tobacco: Never Used  Substance Use Topics  . Alcohol use: No  . Drug use: Yes    Types: Marijuana    Review of Systems Constitutional: No fever/chills or recent injury. Eyes: No visual changes. ENT: No sore throat. Respiratory: Denies shortness of breath. Gastrointestinal: No abdominal pain.  No nausea, no vomiting.  No diarrhea.  No constipation. Musculoskeletal: Negative for pain. Skin: Negative for rash. Neurological:Positive for headache, negative for focal weakness or numbness. No confusion or fainting. ___________________________________________   PHYSICAL EXAM:  VITAL SIGNS: ED Triage Vitals  Enc Vitals Group     BP 08/22/18 1206 128/66     Pulse Rate 08/22/18 1206 81     Resp 08/22/18 1206 18     Temp 08/22/18 1206 97.6 F (36.4 C)     Temp Source 08/22/18 1206 Oral     SpO2 08/22/18 1206 100 %     Weight 08/22/18 1207 134 lb (60.8 kg)     Height 08/22/18  1207 5\' 8"  (1.727 m)     Head Circumference --      Peak Flow --      Pain Score 08/22/18 1207 4     Pain Loc --      Pain Edu? --      Excl. in GC? --     Constitutional: Alert and oriented. Well appearing and in no acute distress. Eyes: Conjunctivae are normal. PERRL. EOMI without expressed pain. No evidence of papilledema  on limited exam. Head: Atraumatic. Nose: No congestion/rhinnorhea. Mouth/Throat: Mucous membranes are moist.  Oropharynx non-erythematous. Neck: No stridor. Supple, no meningismus.  Cardiovascular: Normal rate, regular rhythm. Grossly normal heart sounds.  Good peripheral circulation. Respiratory: Normal respiratory effort.  No retractions. Lungs CTAB. Gastrointestinal: Soft and nontender. No distention.  Musculoskeletal: No lower extremity tenderness nor edema.  No joint effusions. Neurologic:  Normal speech and language. No gross focal neurologic deficits are appreciated. No gait instability. Cranial nerves: 2-10 normal as tested. Cerebellar:Normal Romberg, finger-nose-finger, heel to shin, normal gait. Sensorimotor: No aphasia, pronator drift, clonus, sensory loss or abnormal reflexes.  Skin:  Skin is warm, dry and intact. No rash noted. Psychiatric: Mood and affect are normal. Speech and behavior are normal. Normal thought process and cognition.  ____________________________________________   LABS (all labs ordered are listed, but only abnormal results are displayed)  Labs Reviewed - No data to display ____________________________________________  EKG  Not indicated. ____________________________________________  RADIOLOGY  Ct Head Wo Contrast  Result Date: 08/22/2018 CLINICAL DATA:  Headache, fall last week with injury to head. Diagnosed with concussion at that time. EXAM: CT HEAD WITHOUT CONTRAST TECHNIQUE: Contiguous axial images were obtained from the base of the skull through the vertex without intravenous contrast. COMPARISON:  Head CT dated 11/11/2012. FINDINGS: Brain: Ventricles are within normal limits in size and configuration. All areas of the brain demonstrate appropriate gray-white matter differentiation. There is no mass, hemorrhage, edema or other evidence of acute parenchymal abnormality. No extra-axial hemorrhage. Vascular: No hyperdense vessel or unexpected  calcification. Skull: Normal. Negative for fracture or focal lesion. Sinuses/Orbits: No acute finding. Other: None. IMPRESSION: Negative head CT. No intracranial mass, hemorrhage or edema. No skull fracture. Electronically Signed   By: Bary RichardStan  Maynard M.D.   On: 08/22/2018 13:02   ____________________________________________   PROCEDURES  Procedure(s) performed:  Procedures  Critical Care performed: none ____________________________________________   INITIAL IMPRESSION / ASSESSMENT AND PLAN / ED COURSE  22 year old female presenting to the emergency department for treatment and evaluation of headache and dizziness after syncopal episode last week.  CT performed today does not reveal any acute findings or concerns.  Patient was encouraged to continue taking ibuprofen every 6-8 hours for headache.  She is to keep the appointment with the neurologist in October.  She is to return to the emergency department for symptoms that change or worsen if she is unable to schedule an earlier appointment or see her primary care provider.  Pertinent labs & imaging results that were available during my care of the patient were reviewed by me and considered in my medical decision making (see chart for details). ____________________________________________   FINAL CLINICAL IMPRESSION(S) / ED DIAGNOSES  Final diagnoses:  Acute nonintractable headache, unspecified headache type  Dizziness    ED Discharge Orders    None        Chinita Pesterriplett, Sayda Grable B, FNP 08/23/18 [id number]    Minna AntisPaduchowski, Kevin, MD 08/24/18 1759

## 2018-08-22 NOTE — ED Provider Notes (Signed)
-----------------------------------------   2:28 PM on 08/22/2018 -----------------------------------------  EKG reviewed and interpreted by myself shows normal sinus rhythm at 77 bpm with a narrow QRS, normal axis, normal intervals, no concerning ST changes.  Reassuring EKG.   Minna AntisPaduchowski, Vina Byrd, MD 08/22/18 1428

## 2019-12-25 ENCOUNTER — Ambulatory Visit: Payer: Medicaid Other | Attending: Internal Medicine

## 2019-12-25 DIAGNOSIS — Z20822 Contact with and (suspected) exposure to covid-19: Secondary | ICD-10-CM

## 2019-12-26 LAB — NOVEL CORONAVIRUS, NAA: SARS-CoV-2, NAA: NOT DETECTED

## 2020-05-30 ENCOUNTER — Emergency Department
Admission: EM | Admit: 2020-05-30 | Discharge: 2020-05-30 | Discharge status: Against Medical Advice | Payer: Medicaid Other

## 2020-05-30 NOTE — ED Notes (Signed)
Pt called in the WR with no response 

## 2020-05-30 NOTE — ED Notes (Signed)
Pt called with no response 

## 2021-08-21 ENCOUNTER — Encounter: Payer: Self-pay | Admitting: Radiology

## 2021-08-21 ENCOUNTER — Other Ambulatory Visit: Payer: Self-pay

## 2021-08-21 ENCOUNTER — Emergency Department (HOSPITAL_COMMUNITY)
Admission: EM | Admit: 2021-08-21 | Discharge: 2021-08-21 | Disposition: A | Discharge status: Home / Self Care | Payer: Medicaid Other | Attending: Emergency Medicine | Admitting: Emergency Medicine

## 2021-08-21 ENCOUNTER — Emergency Department
Admission: EM | Admit: 2021-08-21 | Discharge: 2021-08-21 | Disposition: A | Discharge status: Home / Self Care | Payer: Medicaid Other | Attending: Student in an Organized Health Care Education/Training Program | Admitting: Student in an Organized Health Care Education/Training Program

## 2021-08-21 ENCOUNTER — Encounter (HOSPITAL_COMMUNITY): Payer: Self-pay | Admitting: *Deleted

## 2021-08-21 ENCOUNTER — Emergency Department: Payer: Medicaid Other

## 2021-08-21 DIAGNOSIS — R000 Tachycardia, unspecified: Secondary | ICD-10-CM | POA: Insufficient documentation

## 2021-08-21 DIAGNOSIS — R002 Palpitations: Secondary | ICD-10-CM | POA: Insufficient documentation

## 2021-08-21 DIAGNOSIS — R0602 Shortness of breath: Secondary | ICD-10-CM | POA: Insufficient documentation

## 2021-08-21 DIAGNOSIS — R531 Weakness: Secondary | ICD-10-CM | POA: Insufficient documentation

## 2021-08-21 DIAGNOSIS — R42 Dizziness and giddiness: Secondary | ICD-10-CM | POA: Insufficient documentation

## 2021-08-21 DIAGNOSIS — F1290 Cannabis use, unspecified, uncomplicated: Secondary | ICD-10-CM | POA: Insufficient documentation

## 2021-08-21 DIAGNOSIS — Z5321 Procedure and treatment not carried out due to patient leaving prior to being seen by health care provider: Secondary | ICD-10-CM | POA: Insufficient documentation

## 2021-08-21 LAB — BASIC METABOLIC PANEL
Anion gap: 10 (ref 5–15)
BUN: 11 mg/dL (ref 6–20)
CO2: 20 mmol/L — ABNORMAL LOW (ref 22–32)
Calcium: 9 mg/dL (ref 8.9–10.3)
Chloride: 104 mmol/L (ref 98–111)
Creatinine, Ser: 1.1 mg/dL — ABNORMAL HIGH (ref 0.44–1.00)
GFR, Estimated: >60 mL/min (ref >60)
Glucose, Bld: 185 mg/dL — ABNORMAL HIGH (ref 70–99)
Potassium: 3.1 mmol/L — ABNORMAL LOW (ref 3.5–5.1)
Sodium: 134 mmol/L — ABNORMAL LOW (ref 135–145)

## 2021-08-21 LAB — RAPID URINE DRUG SCREEN, HOSP PERFORMED
Amphetamines: NOT DETECTED
Barbiturates: NOT DETECTED
Benzodiazepines: NOT DETECTED
Cocaine: NOT DETECTED
Opiates: NOT DETECTED
Tetrahydrocannabinol: POSITIVE — AB

## 2021-08-21 LAB — COMPREHENSIVE METABOLIC PANEL
ALT: 19 U/L (ref 0–44)
AST: 24 U/L (ref 15–41)
Albumin: 4.3 g/dL (ref 3.5–5.0)
Alkaline Phosphatase: 47 U/L (ref 38–126)
Anion gap: 8 (ref 5–15)
BUN: 13 mg/dL (ref 6–20)
CO2: 24 mmol/L (ref 22–32)
Calcium: 9.4 mg/dL (ref 8.9–10.3)
Chloride: 105 mmol/L (ref 98–111)
Creatinine, Ser: 0.92 mg/dL (ref 0.44–1.00)
GFR, Estimated: >60 mL/min (ref >60)
Glucose, Bld: 97 mg/dL (ref 70–99)
Potassium: 4.1 mmol/L (ref 3.5–5.1)
Sodium: 137 mmol/L (ref 135–145)
Total Bilirubin: 0.8 mg/dL (ref 0.3–1.2)
Total Protein: 8.1 g/dL (ref 6.5–8.1)

## 2021-08-21 LAB — TSH: TSH: 1.157 u[IU]/mL (ref 0.350–4.500)

## 2021-08-21 LAB — I-STAT BETA HCG BLOOD, ED (MC, WL, AP ONLY): I-stat hCG, quantitative: <5 m[IU]/mL (ref <5)

## 2021-08-21 LAB — CBC
HCT: 34.1 % — ABNORMAL LOW (ref 36.0–46.0)
Hemoglobin: 11.5 g/dL — ABNORMAL LOW (ref 12.0–15.0)
MCH: 29.5 pg (ref 26.0–34.0)
MCHC: 33.7 g/dL (ref 30.0–36.0)
MCV: 87.4 fL (ref 80.0–100.0)
Platelets: 217 10*3/uL (ref 150–400)
RBC: 3.9 MIL/uL (ref 3.87–5.11)
RDW: 12.7 % (ref 11.5–15.5)
WBC: 7.9 10*3/uL (ref 4.0–10.5)
nRBC: 0 % (ref 0.0–0.2)

## 2021-08-21 LAB — CBG MONITORING, ED: Glucose-Capillary: 178 mg/dL — ABNORMAL HIGH (ref 70–99)

## 2021-08-21 LAB — TROPONIN I (HIGH SENSITIVITY): Troponin I (High Sensitivity): 2 ng/L (ref <18)

## 2021-08-21 NOTE — ED Provider Notes (Signed)
MSE was initiated and I personally evaluated the patient and placed orders (if any) at  3:44 AM on August 21, 2021.  Patient to ED with sudden onset of generalized tremors/shakiness, SOB, "feeling funny". Symptoms started after smoking marijuana, which she has done before without current symptoms. Unknown whether there was anything added to it.   Today's Vitals   08/21/21 0337  BP: 136/81  Pulse: (!) 147  Resp: 18  Temp: 98.2 F (36.8 C)  TempSrc: Oral  SpO2: 100%   There is no height or weight on file to calculate BMI.  Tachy Shaky Awake, oriented  The patient appears stable so that the remainder of the MSE may be completed by another provider.   Elpidio Anis, PA-C 08/21/21 0346    Palumbo, April, MD 08/21/21 8546

## 2021-08-21 NOTE — ED Provider Notes (Signed)
[facility] Emergency Department Provider Note    Event Date/Time   First MD Initiated Contact with Patient 08/21/21 1944     (approximate)  I have reviewed the triage vital signs and the nursing notes.   HISTORY  Chief Complaint Chest Pain    HPI Cynthia Sosa is a 25 y.o. female the below listed past medical history presents to the ER for evaluation of uneasy feeling and palpitations that started last night just after midnight.  She went to [facility] checked in the ER but left due to long wait.  States that symptoms were resolved spontaneously.  Denies any nausea or vomiting.  Does admit to smoking marijuana daily but no changes to any recent medications.  Denying any chest pain or pressure.  States that she was otherwise feeling fine today and then was with family in the mid afternoon and developed similar palpitations and lightheadedness feeling.  Denies any pleuritic pain.  Not any birth control.  No history of thyroid disease.  States that she has had several episodes of palpitations recently.  Just recently started grad school.  History reviewed. No pertinent past medical history. No family history on file. Past Surgical History:  Procedure Laterality Date   ACHILLES TENDON SURGERY Left 09/06/2017   HYSTEROSCOPY N/A 05/16/2018   Procedure: HYSTEROSCOPY;  Surgeon: Ward, Elenora Fender, MD;  Location: [facility] ORS;  Service: Gynecology;  Laterality: N/A;   IUD REMOVAL N/A 05/16/2018   Procedure: INTRAUTERINE DEVICE (IUD) REMOVAL;  Surgeon: Ward, Elenora Fender, MD;  Location: [facility] ORS;  Service: Gynecology;  Laterality: N/A;   Patient Active Problem List   Diagnosis Date Noted   IUD complication (HCC) 05/16/2018      Prior to Admission medications   Medication Sig Start Date End Date Taking? Authorizing Provider  cetirizine (ZYRTEC) 10 MG tablet Take 10 mg by mouth daily. 09/11/16   [provider]  fluconazole (DIFLUCAN) 150 MG tablet Take 1 tablet (150  mg total) by mouth once as needed (yeast infection). Patient not taking: Reported on 05/16/2018 11/24/16   Nita Sickle, MD  [facility] HFA 108 [phone] Base) MCG/ACT inhaler Inhale 2 puffs into the lungs 4 (four) times daily as needed. 10/16/16   [provider]    Allergies Patient has no known allergies.    Social History Social History   Tobacco Use   Smoking status: Never   Smokeless tobacco: Never  Substance Use Topics   Alcohol use: Yes   Drug use: Yes    Types: Marijuana    Review of Systems Patient denies headaches, rhinorrhea, blurry vision, numbness, shortness of breath, chest pain, edema, cough, abdominal pain, nausea, vomiting, diarrhea, dysuria, fevers, rashes or hallucinations unless otherwise stated above in HPI. ____________________________________________   PHYSICAL EXAM:  VITAL SIGNS: Vitals:   08/21/21 1912  BP: 113/70  Pulse: 78  Resp: 18  Temp: 98.3 F (36.8 C)  SpO2: 100%    Constitutional: Alert and oriented.  Eyes: Conjunctivae are normal.  Head: Atraumatic. Nose: No congestion/rhinnorhea. Mouth/Throat: Mucous membranes are moist.   Neck: No stridor. Painless ROM.  Cardiovascular: Normal rate, regular rhythm. Grossly normal heart sounds.  Good peripheral circulation. Respiratory: Normal respiratory effort.  No retractions. Lungs CTAB. Gastrointestinal: Soft and nontender. No distention. No abdominal bruits. No CVA tenderness. Genitourinary:  Musculoskeletal: No lower extremity tenderness nor edema.  No joint effusions. Neurologic:  Normal speech and language. No gross focal neurologic deficits are appreciated. No facial droop Skin:  Skin  is warm, dry and intact. No rash noted. Psychiatric: Mood and affect are normal. Speech and behavior are normal.  ____________________________________________   LABS (all labs ordered are listed, but only abnormal results are displayed)  Results for orders placed or performed during the hospital  encounter of 08/21/21 (from the past 24 hour(s))  CBC     Status: Abnormal   Collection Time: 08/21/21  7:22 PM  Result Value Ref Range   WBC 7.9 4.0 - 10.5 K/uL   RBC 3.90 3.87 - 5.11 MIL/uL   Hemoglobin 11.5 (L) 12.0 - 15.0 g/dL   HCT 75.6 (L) 43.3 - 29.5 %   MCV 87.4 80.0 - 100.0 fL   MCH 29.5 26.0 - 34.0 pg   MCHC 33.7 30.0 - 36.0 g/dL   RDW 18.8 41.6 - 60.6 %   Platelets 217 150 - 400 K/uL   nRBC 0.0 0.0 - 0.2 %  Comprehensive metabolic panel     Status: None   Collection Time: 08/21/21  7:22 PM  Result Value Ref Range   Sodium 137 135 - 145 mmol/L   Potassium 4.1 3.5 - 5.1 mmol/L   Chloride 105 98 - 111 mmol/L   CO2 24 22 - 32 mmol/L   Glucose, Bld 97 70 - 99 mg/dL   BUN 13 6 - 20 mg/dL   Creatinine, Ser 3.01 0.44 - 1.00 mg/dL   Calcium 9.4 8.9 - 60.1 mg/dL   Total Protein 8.1 6.5 - 8.1 g/dL   Albumin 4.3 3.5 - 5.0 g/dL   AST 24 15 - 41 U/L   ALT 19 0 - 44 U/L   Alkaline Phosphatase 47 38 - 126 U/L   Total Bilirubin 0.8 0.3 - 1.2 mg/dL   GFR, Estimated >09 >32 mL/min   Anion gap 8 5 - 15  Troponin I (High Sensitivity)     Status: None   Collection Time: 08/21/21  7:22 PM  Result Value Ref Range   Troponin I (High Sensitivity) 2 <18 ng/L  TSH     Status: None   Collection Time: 08/21/21  7:22 PM  Result Value Ref Range   TSH 1.157 0.350 - 4.500 uIU/mL   ____________________________________________  EKG My review and personal interpretation at Time: 19:16   Indication: palpitations  Rate: 90  Rhythm: sinus Axis: normal Other: normal intervals, may have slight delta wave appearance in V3 but PR interval normal, no Brugada, nonspecific T wave abnormality, no ST elevations or depressions ____________________________________________  RADIOLOGY  I personally reviewed all radiographic images ordered to evaluate for the above acute complaints and reviewed radiology reports and findings.  These findings were personally discussed with the patient.  Please see medical  record for radiology report.  ____________________________________________   PROCEDURES  Procedure(s) performed:  Procedures    Critical Care performed: no ____________________________________________   INITIAL IMPRESSION / ASSESSMENT AND PLAN / ED COURSE  Pertinent labs & imaging results that were available during my care of the patient were reviewed by me and considered in my medical decision making (see chart for details).   DDX: Palpitations, electrolyte abnormality, dysrhythmia, dehydration, substance abuse, hyperthyroid, PE  Oddie Judie Petit Pore is a 25 y.o. who presents to the ED with presentation as described above.  She is clinically very well-appearing in no acute distress.  EKG here shows sinus rhythm with some nonspecific T wave abnormality but troponin is undetectable and almost 24 hours out from onset of her symptoms.  She denies any chest pain or pressure  at this time.  Is not consistent with ACS.  She is low risk by Wells criteria and is PERC negative.  Her blood work is reassuring.  Chest x-ray also reassuring.  TSH normal.  Review of EKG from last night showed sinus tachycardia with nonspecific changes.  Admits that she has been under some stress.  I encouraged her to avoid stimulants drink plenty of fluids and will give referral to cardiology for further work-up.  Patient agreeable to plan.     The patient was evaluated in Emergency Department today for the symptoms described in the history of present illness. He/she was evaluated in the context of the global COVID-19 pandemic, which necessitated consideration that the patient might be at risk for infection with the SARS-CoV-2 virus that causes COVID-19. Institutional protocols and algorithms that pertain to the evaluation of patients at risk for COVID-19 are in a state of rapid change based on information released by regulatory bodies including the CDC and federal and state organizations. These policies and algorithms were  followed during the patient's care in the ED.  As part of my medical decision making, I reviewed the following data within the electronic MEDICAL RECORD NUMBER Nursing notes reviewed and incorporated, Labs reviewed, notes from prior ED visits and [place] Controlled Substance Database   ____________________________________________   FINAL CLINICAL IMPRESSION(S) / ED DIAGNOSES  Final diagnoses:  Palpitations      NEW MEDICATIONS STARTED DURING THIS VISIT:  New Prescriptions   No medications on file     Note:  This document was prepared using Dragon voice recognition software and may include unintentional dictation errors.    Willy Eddy, MD 08/21/21 2128

## 2021-08-21 NOTE — ED Triage Notes (Signed)
States around 0200 she had heart racing and shaking all over. Pt does have a hx of anxiety. Went to [facility] for the same this am and they did cardiac workup. Pt left prior to being seen. Pt here for persistent symptoms.

## 2021-08-21 NOTE — ED Triage Notes (Signed)
C/o rapid heart rate onset 1 hour ago , states she felt like she was having a panic attack , states her body was shaking, states she has has panic attacks in the past however not his bad

## 2021-08-24 ENCOUNTER — Ambulatory Visit (INDEPENDENT_AMBULATORY_CARE_PROVIDER_SITE_OTHER): Payer: Self-pay

## 2021-08-24 ENCOUNTER — Ambulatory Visit (INDEPENDENT_AMBULATORY_CARE_PROVIDER_SITE_OTHER): Payer: Self-pay | Admitting: Cardiology

## 2021-08-24 ENCOUNTER — Other Ambulatory Visit: Payer: Self-pay

## 2021-08-24 ENCOUNTER — Encounter: Payer: Self-pay | Admitting: Cardiology

## 2021-08-24 VITALS — BP 100/62 | HR 78 | Ht 68.0 in | Wt 117.0 lb

## 2021-08-24 DIAGNOSIS — R002 Palpitations: Secondary | ICD-10-CM

## 2021-08-24 DIAGNOSIS — Z7289 Other problems related to lifestyle: Secondary | ICD-10-CM

## 2021-08-24 NOTE — Progress Notes (Signed)
Cardiology Office Note:    Date:  08/24/2021   ID:  [name], DOB 12/02/1996, MRN [id number]  PCP:  [facility] Providers Cardiologist:  None     Referring MD: [facility]*   Chief Complaint  Patient presents with   New Patient (Initial Visit)    Referred by ED for Palpitations. Meds reviewed verbally with patient.     History of Present Illness:    Cynthia Sosa is a 25 y.o. female with no significant past medical history who presents due to palpitations.  She was at home when she suddenly felt her heart beating fast, also states shivering.  Symptoms lasted approximately 2 hours.  Was seen in the ED at [facility] 3 days ago where EKG showed sinus tachycardia heart rate 147.  She was not completely evaluated, left due to long wait times of over 12 hours.  Presented at [facility] ED at which point symptoms have resolved spontaneously.  EKG obtained showed sinus rhythm with heart rate 90.  She was discharged home to follow-up with cardiology.  States previous symptoms of palpitations lasting 30 minutes was 2 weeks ago, this was during her menses, she thought this was probably due to hormonal imbalance.  She engages in e-cigarette use.  Endorses drinking caffeine and energy drinks.  History reviewed. No pertinent past medical history.  Past Surgical History:  Procedure Laterality Date   ACHILLES TENDON SURGERY Left 09/06/2017   HYSTEROSCOPY N/A 05/16/2018   Procedure: HYSTEROSCOPY;  Surgeon: Ward, Elenora Fender, MD;  Location: [facility] ORS;  Service: Gynecology;  Laterality: N/A;   IUD REMOVAL N/A 05/16/2018   Procedure: INTRAUTERINE DEVICE (IUD) REMOVAL;  Surgeon: Ward, Elenora Fender, MD;  Location: [facility] ORS;  Service: Gynecology;  Laterality: N/A;    Current Medications: Current Meds  Medication Sig   PROAIR HFA 108 (90 Base) MCG/ACT inhaler Inhale 2 puffs into the lungs 4 (four) times daily as needed.     Allergies:   Cat hair extract    Social History   Socioeconomic History   Marital status: Single    Spouse name: Not on file   Number of children: Not on file   Years of education: Not on file   Highest education level: Not on file  Occupational History   Not on file  Tobacco Use   Smoking status: Never   Smokeless tobacco: Never  Substance and Sexual Activity   Alcohol use: Yes   Drug use: Yes    Types: Marijuana   Sexual activity: Not on file  Other Topics Concern   Not on file  Social History Narrative   Not on file   Social Determinants of Health   Financial Resource Strain: Not on file  Food Insecurity: Not on file  Transportation Needs: Not on file  Physical Activity: Not on file  Stress: Not on file  Social Connections: Not on file     Family History: The patient's family history is not on file.  ROS:   Please see the history of present illness.     All other systems reviewed and are negative.  EKGs/Labs/Other Studies Reviewed:    The following studies were reviewed today:   EKG:  EKG is  ordered today.  The ekg ordered today demonstrates normal sinus rhythm, nonspecific changes  Recent Labs: 08/21/2021: ALT 19; BUN 13; Creatinine, Ser 0.92; Hemoglobin 11.5; Platelets 217; Potassium 4.1; Sodium 137; TSH 1.157  Recent Lipid Panel No results found  for: CHOL, TRIG, HDL, CHOLHDL, VLDL, LDLCALC, LDLDIRECT   Risk Assessment/Calculations:          Physical Exam:    VS:  BP 100/62 (BP Location: Left Arm, Patient Position: Sitting, Cuff Size: Normal)   Pulse 78   Ht 5\' 8"  (1.727 m)   Wt 117 lb (53.1 kg)   LMP 08/14/2021 (Exact Date)   SpO2 97%   BMI 17.79 kg/m     Wt Readings from Last 3 Encounters:  08/24/21 117 lb (53.1 kg)  08/21/21 125 lb (56.7 kg)  08/21/21 125 lb (56.7 kg)     GEN:  Well nourished, well developed in no acute distress HEENT: Normal NECK: No JVD; No carotid bruits LYMPHATICS: No lymphadenopathy CARDIAC: RRR, no murmurs, rubs, gallops RESPIRATORY:   Clear to auscultation without rales, wheezing or rhonchi  ABDOMEN: Soft, non-tender, non-distended MUSCULOSKELETAL:  No edema; No deformity  SKIN: Warm and dry NEUROLOGIC:  Alert and oriented x 3 PSYCHIATRIC:  Normal affect   ASSESSMENT:    1. Palpitations   2. Engages in vaping    PLAN:    In order of problems listed above:  Palpitations etiology includes possible SVT versus inappropriate sinus tach.  EKG in the ED showed sinus tach with heart rate 147.  Place cardiac monitor to evaluate arrhythmia present.  Caffeine intake, energy drink use reduction advised. Engages in vaping.  Cessation advised.  Follow-up after cardiac monitor.     Medication Adjustments/Labs and Tests Ordered: Current medicines are reviewed at length with the patient today.  Concerns regarding medicines are outlined above.  Orders Placed This Encounter  Procedures   LONG TERM MONITOR (3-14 DAYS)   EKG 12-Lead   No orders of the defined types were placed in this encounter.   Patient Instructions  Medication Instructions:  Your physician recommends that you continue on your current medications as directed. Please refer to the Current Medication list given to you today.  *If you need a refill on your cardiac medications before your next appointment, please call your pharmacy*   Lab Work: None ordered If you have labs (blood work) drawn today and your tests are completely normal, you will receive your results only by: MyChart Message (if you have MyChart) OR A paper copy in the mail If you have any lab test that is abnormal or we need to change your treatment, we will call you to review the results.   Testing/Procedures:   Your physician has recommended that you wear a Zio XT monitor for 2 weeks.   This monitor is a medical device that records the heart's electrical activity. Doctors most often use these monitors to diagnose arrhythmias. Arrhythmias are problems with the speed or rhythm of the  heartbeat. The monitor is a small device applied to your chest. You can wear one while you do your normal daily activities. While wearing this monitor if you have any symptoms to push the button and record what you felt. Once you have worn this monitor for the period of time provider prescribed (Usually 14 days), you will return the monitor device in the postage paid box. Once it is returned they will download the data collected and provide 10/21/21 with a report which the provider will then review and we will call you with those results. Important tips:  Avoid showering during the first 24 hours of wearing the monitor. Avoid excessive sweating to help maximize wear time. Do not submerge the device, no hot tubs, and no swimming pools.  Keep any lotions or oils away from the patch. After 24 hours you may shower with the patch on. Take brief showers with your back facing the shower head.  Do not remove patch once it has been placed because that will interrupt data and decrease adhesive wear time. Push the button when you have any symptoms and write down what you were feeling. Once you have completed wearing your monitor, remove and place into box which has postage paid and place in your outgoing mailbox.  If for some reason you have misplaced your box then call our office and we can provide another box and/or mail it off for you.      Follow-Up: At [facility], you and your health needs are our priority.  As part of our continuing mission to provide you with exceptional heart care, we have created designated Provider Care Teams.  These Care Teams include your primary Cardiologist (physician) and Advanced Practice Providers (APPs -  Physician Assistants and Nurse Practitioners) who all work together to provide you with the care you need, when you need it.  We recommend signing up for the patient portal called "MyChart".  Sign up information is provided on this After Visit Summary.  MyChart is used to  connect with patients for Virtual Visits (Telemedicine).  Patients are able to view lab/test results, encounter notes, upcoming appointments, etc.  Non-urgent messages can be sent to your provider as well.   To learn more about what you can do with MyChart, go to [email or web address].    Your next appointment:   5-6 week(s)  The format for your next appointment:   In Person  Provider:   You may see Dr. Azucena Cecil or one of the following Advanced Practice Providers on your designated Care Team:   Nicolasa Ducking, NP Eula Listen, PA-C Marisue Ivan, PA-C Cadence Ripley, [place]   Other Instructions    Signed, Debbe Odea, MD  08/24/2021 12:38 PM    [facility]

## 2021-08-24 NOTE — Patient Instructions (Signed)
Medication Instructions:  Your physician recommends that you continue on your current medications as directed. Please refer to the Current Medication list given to you today.  *If you need a refill on your cardiac medications before your next appointment, please call your pharmacy*   Lab Work: None ordered If you have labs (blood work) drawn today and your tests are completely normal, you will receive your results only by: MyChart Message (if you have MyChart) OR A paper copy in the mail If you have any lab test that is abnormal or we need to change your treatment, we will call you to review the results.   Testing/Procedures:   Your physician has recommended that you wear a Zio XT monitor for 2 weeks.   This monitor is a medical device that records the heart's electrical activity. Doctors most often use these monitors to diagnose arrhythmias. Arrhythmias are problems with the speed or rhythm of the heartbeat. The monitor is a small device applied to your chest. You can wear one while you do your normal daily activities. While wearing this monitor if you have any symptoms to push the button and record what you felt. Once you have worn this monitor for the period of time provider prescribed (Usually 14 days), you will return the monitor device in the postage paid box. Once it is returned they will download the data collected and provide [place] with a report which the provider will then review and we will call you with those results. Important tips:  Avoid showering during the first 24 hours of wearing the monitor. Avoid excessive sweating to help maximize wear time. Do not submerge the device, no hot tubs, and no swimming pools. Keep any lotions or oils away from the patch. After 24 hours you may shower with the patch on. Take brief showers with your back facing the shower head.  Do not remove patch once it has been placed because that will interrupt data and decrease adhesive wear time. Push  the button when you have any symptoms and write down what you were feeling. Once you have completed wearing your monitor, remove and place into box which has postage paid and place in your outgoing mailbox.  If for some reason you have misplaced your box then call our office and we can provide another box and/or mail it off for you.      Follow-Up: At [facility], you and your health needs are our priority.  As part of our continuing mission to provide you with exceptional heart care, we have created designated Provider Care Teams.  These Care Teams include your primary Cardiologist (physician) and Advanced Practice Providers (APPs -  Physician Assistants and Nurse Practitioners) who all work together to provide you with the care you need, when you need it.  We recommend signing up for the patient portal called "MyChart".  Sign up information is provided on this After Visit Summary.  MyChart is used to connect with patients for Virtual Visits (Telemedicine).  Patients are able to view lab/test results, encounter notes, upcoming appointments, etc.  Non-urgent messages can be sent to your provider as well.   To learn more about what you can do with MyChart, go to [email or web address].    Your next appointment:   5-6 week(s)  The format for your next appointment:   In Person  Provider:   You may see Dr. Azucena Cecil or one of the following Advanced Practice Providers on your designated Care Team:   Nicolasa Ducking, NP  Eula Listen, PA-C Marisue Ivan, PA-C Cadence Fransico Michael, [place]   Other Instructions

## 2021-08-25 ENCOUNTER — Other Ambulatory Visit (HOSPITAL_COMMUNITY): Payer: Self-pay

## 2021-08-25 MED ORDER — AZITHROMYCIN 250 MG PO TABS
ORAL_TABLET | ORAL | 0 refills | Status: DC
  Filled 2021-08-25 (×2): qty 4, 1d supply, fill #0

## 2021-09-29 ENCOUNTER — Ambulatory Visit (INDEPENDENT_AMBULATORY_CARE_PROVIDER_SITE_OTHER): Payer: Self-pay | Admitting: Cardiology

## 2021-09-29 ENCOUNTER — Other Ambulatory Visit: Payer: Self-pay

## 2021-09-29 ENCOUNTER — Encounter: Payer: Self-pay | Admitting: Cardiology

## 2021-09-29 VITALS — BP 104/80 | HR 84 | Ht 68.0 in | Wt 125.0 lb

## 2021-09-29 DIAGNOSIS — Z7289 Other problems related to lifestyle: Secondary | ICD-10-CM

## 2021-09-29 DIAGNOSIS — R002 Palpitations: Secondary | ICD-10-CM

## 2021-09-29 NOTE — Patient Instructions (Signed)
Medication Instructions:  Your physician recommends that you continue on your current medications as directed. Please refer to the Current Medication list given to you today.  *If you need a refill on your cardiac medications before your next appointment, please call your pharmacy*   Lab Work: None ordered If you have labs (blood work) drawn today and your tests are completely normal, you will receive your results only by: . MyChart Message (if you have MyChart) OR . A paper copy in the mail If you have any lab test that is abnormal or we need to change your treatment, we will call you to review the results.   Testing/Procedures: None ordered   Follow-Up: At [facility], you and your health needs are our priority.  As part of our continuing mission to provide you with exceptional heart care, we have created designated Provider Care Teams.  These Care Teams include your primary Cardiologist (physician) and Advanced Practice Providers (APPs -  Physician Assistants and Nurse Practitioners) who all work together to provide you with the care you need, when you need it.  We recommend signing up for the patient portal called "MyChart".  Sign up information is provided on this After Visit Summary.  MyChart is used to connect with patients for Virtual Visits (Telemedicine).  Patients are able to view lab/test results, encounter notes, upcoming appointments, etc.  Non-urgent messages can be sent to your provider as well.   To learn more about what you can do with MyChart, go to [email or web address].    Your next appointment:   As needed   The format for your next appointment:   In Person  Provider:   You may see Brian Agbor-Etang, MD or one of the following Advanced Practice Providers on your designated Care Team:    Christopher Berge, NP  Ryan Dunn, PA-C  Jacquelyn Visser, PA-C  Cadence Furth, PA-C    Other Instructions N/A  

## 2021-09-29 NOTE — Progress Notes (Signed)
Cardiology Office Note:    Date:  09/29/2021   ID:  Cynthia Sosa, DOB 1996-06-09, MRN [id number]  PCP:  [facility], [facility] Providers Cardiologist:  None     Referring MD: [facility], [facility]*   Chief Complaint  Patient presents with   Follow-up    F/U after wearing ZIO monitor     History of Present Illness:    Cynthia Sosa is a 25 y.o. female with no significant past medical history who presents for follow-up.  She was last seen due to palpitations.  Previously presented to the ED due to palpitations, but left due to long wait times.  Cardiac monitor was placed to evaluate any significant arrhythmias.  Reduction in caffeine intake and energy drinks was advised after last visit.  Presents for cardiac monitor results.  Symptoms have overall improved, as patient's cut back on caffeine and energy drinks.  She still vapes, is working on quitting.   History reviewed. No pertinent past medical history.  Past Surgical History:  Procedure Laterality Date   ACHILLES TENDON SURGERY Left 09/06/2017   HYSTEROSCOPY N/A 05/16/2018   Procedure: HYSTEROSCOPY;  Surgeon: Ward, Elenora Fender, MD;  Location: [facility] ORS;  Service: Gynecology;  Laterality: N/A;   IUD REMOVAL N/A 05/16/2018   Procedure: INTRAUTERINE DEVICE (IUD) REMOVAL;  Surgeon: Ward, Elenora Fender, MD;  Location: [facility] ORS;  Service: Gynecology;  Laterality: N/A;    Current Medications: Current Meds  Medication Sig   PROAIR HFA 108 (90 Base) MCG/ACT inhaler Inhale 2 puffs into the lungs 4 (four) times daily as needed.     Allergies:   Cat hair extract   Social History   Socioeconomic History   Marital status: Single    Spouse name: Not on file   Number of children: Not on file   Years of education: Not on file   Highest education level: Not on file  Occupational History   Not on file  Tobacco Use   Smoking status: Never   Smokeless tobacco: Never  Vaping Use   Vaping Use: Every day   Substance and Sexual Activity   Alcohol use: Yes    Comment: occassionally   Drug use: Yes    Types: Marijuana   Sexual activity: Not on file  Other Topics Concern   Not on file  Social History Narrative   Not on file   Social Determinants of Health   Financial Resource Strain: Not on file  Food Insecurity: Not on file  Transportation Needs: Not on file  Physical Activity: Not on file  Stress: Not on file  Social Connections: Not on file     Family History: The patient's family history includes Asthma in her brother; Hypertension in her mother; Prostate cancer in her father.  ROS:   Please see the history of present illness.     All other systems reviewed and are negative.  EKGs/Labs/Other Studies Reviewed:    The following studies were reviewed today:   EKG:  EKG not ordered today.    Recent Labs: 08/21/2021: ALT 19; BUN 13; Creatinine, Ser 0.92; Hemoglobin 11.5; Platelets 217; Potassium 4.1; Sodium 137; TSH 1.157  Recent Lipid Panel No results found for: CHOL, TRIG, HDL, CHOLHDL, VLDL, LDLCALC, LDLDIRECT   Risk Assessment/Calculations:          Physical Exam:    VS:  BP 104/80 (BP Location: Left Arm, Patient Position: Sitting, Cuff Size: Normal)   Pulse 84   Ht 5'  8" (1.727 m)   Wt 125 lb (56.7 kg)   SpO2 99%   BMI 19.01 kg/m     Wt Readings from Last 3 Encounters:  09/29/21 125 lb (56.7 kg)  08/24/21 117 lb (53.1 kg)  08/21/21 125 lb (56.7 kg)     GEN:  Well nourished, well developed in no acute distress HEENT: Normal NECK: No JVD; No carotid bruits LYMPHATICS: No lymphadenopathy CARDIAC: RRR, no murmurs, rubs, gallops RESPIRATORY:  Clear to auscultation without rales, wheezing or rhonchi  ABDOMEN: Soft, non-tender, non-distended MUSCULOSKELETAL:  No edema; No deformity  SKIN: Warm and dry NEUROLOGIC:  Alert and oriented x 3 PSYCHIATRIC:  Normal affect   ASSESSMENT:    1. Palpitations   2. Engages in vaping     PLAN:    In order of  problems listed above:  Palpitations, cardiac monitor did not reveal any significant arrhythmias.  Patient made aware of results, reassured.  Caffeine intake, energy drink use likely causation of symptoms as her symptoms have overall improved/resolved since stopping energy drinks and reducing her caffeine intake. Engages in vaping.  Cessation advised.  Follow-up as needed     Medication Adjustments/Labs and Tests Ordered: Current medicines are reviewed at length with the patient today.  Concerns regarding medicines are outlined above.  No orders of the defined types were placed in this encounter.  No orders of the defined types were placed in this encounter.   Patient Instructions  Medication Instructions:  Your physician recommends that you continue on your current medications as directed. Please refer to the Current Medication list given to you today.  *If you need a refill on your cardiac medications before your next appointment, please call your pharmacy*   Lab Work: None ordered If you have labs (blood work) drawn today and your tests are completely normal, you will receive your results only by: MyChart Message (if you have MyChart) OR A paper copy in the mail If you have any lab test that is abnormal or we need to change your treatment, we will call you to review the results.   Testing/Procedures: None ordered   Follow-Up: At [facility], you and your health needs are our priority.  As part of our continuing mission to provide you with exceptional heart care, we have created designated Provider Care Teams.  These Care Teams include your primary Cardiologist (physician) and Advanced Practice Providers (APPs -  Physician Assistants and Nurse Practitioners) who all work together to provide you with the care you need, when you need it.  We recommend signing up for the patient portal called "MyChart".  Sign up information is provided on this After Visit Summary.  MyChart is  used to connect with patients for Virtual Visits (Telemedicine).  Patients are able to view lab/test results, encounter notes, upcoming appointments, etc.  Non-urgent messages can be sent to your provider as well.   To learn more about what you can do with MyChart, go to [email or web address].    Your next appointment:   As needed  The format for your next appointment:   In Person  Provider:   You may see Debbe Odea, MD or one of the following Advanced Practice Providers on your designated Care Team:   Nicolasa Ducking, NP Eula Listen, PA-C Marisue Ivan, PA-C Cadence [place], [place]   Other Instructions N/A   Signed, Debbe Odea, MD  09/29/2021 12:47 PM    [facility]

## 2021-11-29 ENCOUNTER — Encounter: Payer: Self-pay | Admitting: Emergency Medicine

## 2021-11-29 ENCOUNTER — Emergency Department
Admission: EM | Admit: 2021-11-29 | Discharge: 2021-11-29 | Disposition: A | Discharge status: Home / Self Care | Payer: Medicaid Other | Attending: Emergency Medicine | Admitting: Emergency Medicine

## 2021-11-29 ENCOUNTER — Emergency Department: Payer: Medicaid Other

## 2021-11-29 DIAGNOSIS — M5412 Radiculopathy, cervical region: Secondary | ICD-10-CM

## 2021-11-29 DIAGNOSIS — Z20822 Contact with and (suspected) exposure to covid-19: Secondary | ICD-10-CM | POA: Insufficient documentation

## 2021-11-29 DIAGNOSIS — R0602 Shortness of breath: Secondary | ICD-10-CM | POA: Insufficient documentation

## 2021-11-29 LAB — BASIC METABOLIC PANEL
Anion gap: 3 — ABNORMAL LOW (ref 5–15)
BUN: 12 mg/dL (ref 6–20)
CO2: 25 mmol/L (ref 22–32)
Calcium: 8.3 mg/dL — ABNORMAL LOW (ref 8.9–10.3)
Chloride: 107 mmol/L (ref 98–111)
Creatinine, Ser: 0.8 mg/dL (ref 0.44–1.00)
GFR, Estimated: >60 mL/min (ref >60)
Glucose, Bld: 91 mg/dL (ref 70–99)
Potassium: 3.6 mmol/L (ref 3.5–5.1)
Sodium: 135 mmol/L (ref 135–145)

## 2021-11-29 LAB — CBC
HCT: 32.3 % — ABNORMAL LOW (ref 36.0–46.0)
Hemoglobin: 10.6 g/dL — ABNORMAL LOW (ref 12.0–15.0)
MCH: 29.4 pg (ref 26.0–34.0)
MCHC: 32.8 g/dL (ref 30.0–36.0)
MCV: 89.5 fL (ref 80.0–100.0)
Platelets: 208 10*3/uL (ref 150–400)
RBC: 3.61 MIL/uL — ABNORMAL LOW (ref 3.87–5.11)
RDW: 12.3 % (ref 11.5–15.5)
WBC: 8.8 10*3/uL (ref 4.0–10.5)
nRBC: 0 % (ref 0.0–0.2)

## 2021-11-29 LAB — RESP PANEL BY RT-PCR (FLU A&B, COVID) ARPGX2
Influenza A by PCR: NEGATIVE
Influenza B by PCR: NEGATIVE
SARS Coronavirus 2 by RT PCR: NEGATIVE

## 2021-11-29 LAB — HCG, QUANTITATIVE, PREGNANCY: hCG, Beta Chain, Quant, S: <1 m[IU]/mL (ref <5)

## 2021-11-29 LAB — D-DIMER, QUANTITATIVE: D-Dimer, Quant: <0.27 ug/mL-FEU (ref 0.00–0.50)

## 2021-11-29 LAB — TROPONIN I (HIGH SENSITIVITY): Troponin I (High Sensitivity): <2 ng/L (ref <18)

## 2021-11-29 LAB — POC URINE PREG, ED: Preg Test, Ur: NEGATIVE

## 2021-11-29 MED ORDER — ALBUTEROL SULFATE HFA 108 (90 BASE) MCG/ACT IN AERS: 2.0000 | INHALATION_SPRAY | RESPIRATORY_TRACT | 0 refills | Status: AC | PRN

## 2021-11-29 NOTE — Discharge Instructions (Signed)
Use albuterol inhaler 2 puffs every 4 hours as needed for breathing difficulty.  Return to the ER for worsening symptoms, persistent vomiting, difficulty breathing, extremity weakness or other concerns.

## 2021-11-29 NOTE — ED Provider Notes (Addendum)
8:46 AM Assumed care for off going team.   Blood pressure 105/63, pulse 82, temperature 98.1 F (36.7 C), temperature source Oral, resp. rate 18, SpO2 98 %.  See their HPI for full report but in brief pending repeat troponin.    Pulmonary hyperinflation  COVID, flu are negative D-dimer is negative.  Recommended repeat troponin.  The nurse tried to draw the repeat troponin but patient was declining.  I went over to explain why the repeat troponin was recommended by the off going doctor given onset of symptoms however patient is adamant that she does not want to stay for it.  She understands that there is a risk for heart problems that would not of been detected.  Patient is otherwise healthy with normal vital signs and is only 25 years old and is requesting not to have the repeat troponin done.  She has capacity understands the risk of declining this troponin.  Given this we will discharge patient with a plan for the albuterol inhaler and she will return if she develops return of chest pain, shortness of breath but at this time she denies any symptoms  Patient requesting discharge papers have been waiting for a good amount of time.  Nurses been tied up with a new patient so patient provided discharge papers.  She denies any shortness of breath or chest pain at and I did go through the discharge papers with her        Concha Se, MD 11/29/21 0848    Concha Se, MD 11/29/21 [phone]

## 2021-11-29 NOTE — ED Notes (Signed)
Presents to the ED with complaints of SOB. Pt states that she woke on 3 separate times throughout the night waking and feeling like she was "gasping for her breath".  C/o intermittent numbness to the L side/arm. No acute distress. RR even and nonlabored.

## 2021-11-29 NOTE — ED Triage Notes (Signed)
Pt reports she woke "several" times throughout the night and was not breathing and felt she was holding her breath. Pt to ED due to concern of SOB. Denies SOB currently.

## 2021-11-29 NOTE — ED Provider Notes (Signed)
[facility] Emergency Department Provider Note   ____________________________________________   Event Date/Time   First MD Initiated Contact with Patient 11/29/21 [phone]     (approximate)  I have reviewed the triage vital signs and the nursing notes.   HISTORY  Chief Complaint Shortness of Breath    HPI Cynthia Sosa is a 25 y.o. female who presents to the ED from home with a chief complaint of shortness of breath.  Patient reports she awoke several times throughout the night and felt like she was not breathing as if she was holding her breath.  Has had nasal congestion for a few days.  Also has noted intermittent left arm numbness for the past several days, especially when sleeping on that side.  Denies fever, cough, chest pain, abdominal pain, nausea, vomiting or dizziness.  Denies confusion, slurred speech, facial droop, extremity weakness.  Denies trauma or hormone use.      Past medical history None  Patient Active Problem List   Diagnosis Date Noted   IUD complication (HCC) 05/16/2018    Past Surgical History:  Procedure Laterality Date   ACHILLES TENDON SURGERY Left 09/06/2017   HYSTEROSCOPY N/A 05/16/2018   Procedure: HYSTEROSCOPY;  Surgeon: Ward, Elenora Fender, MD;  Location: [facility] ORS;  Service: Gynecology;  Laterality: N/A;   IUD REMOVAL N/A 05/16/2018   Procedure: INTRAUTERINE DEVICE (IUD) REMOVAL;  Surgeon: Ward, Elenora Fender, MD;  Location: [facility] ORS;  Service: Gynecology;  Laterality: N/A;    Prior to Admission medications   Medication Sig Start Date End Date Taking? Authorizing Provider  albuterol (VENTOLIN HFA) 108 (90 Base) MCG/ACT inhaler Inhale 2 puffs into the lungs every 4 (four) hours as needed for wheezing or shortness of breath. 11/29/21  Yes Irean Hong, MD    Allergies Cat hair extract  Family History  Problem Relation Age of Onset   Hypertension Mother    Prostate cancer Father    Asthma Brother     Social  History Social History   Tobacco Use   Smoking status: Never   Smokeless tobacco: Never  Vaping Use   Vaping Use: Every day  Substance Use Topics   Alcohol use: Yes    Comment: occassionally   Drug use: Yes    Types: Marijuana    Review of Systems  Constitutional: No fever/chills Eyes: No visual changes. ENT: No sore throat. Cardiovascular: Denies chest pain. Respiratory: Positive for shortness of breath. Gastrointestinal: No abdominal pain.  No nausea, no vomiting.  No diarrhea.  No constipation. Genitourinary: Negative for dysuria. Musculoskeletal: Negative for back pain. Skin: Negative for rash. Neurological: Positive for intermittent left arm numbness.  Negative for headaches or focal weakness.   ____________________________________________   PHYSICAL EXAM:  VITAL SIGNS: ED Triage Vitals [11/29/21 0458]  Enc Vitals Group     BP 109/73     Pulse Rate 76     Resp 16     Temp 98.1 F (36.7 C)     Temp Source Oral     SpO2 98 %     Weight      Height      Head Circumference      Peak Flow      Pain Score      Pain Loc      Pain Edu?      Excl. in GC?     Constitutional: Alert and oriented. Well appearing and in no acute distress. Eyes: Conjunctivae are normal. PERRL. EOMI. Head:  Atraumatic. Nose: No congestion/rhinnorhea. Mouth/Throat: Mucous membranes are moist.   Neck: No stridor.  No cervical spine tenderness to palpation.  No carotid bruits. Cardiovascular: Normal rate, regular rhythm. Grossly normal heart sounds.  Good peripheral circulation. Respiratory: Normal respiratory effort.  No retractions. Lungs CTAB. Gastrointestinal: Soft and nontender. No distention. No abdominal bruits. No CVA tenderness. Musculoskeletal: No lower extremity tenderness nor edema.  No joint effusions. Neurologic:  Normal speech and language. No gross focal neurologic deficits are appreciated.  5/5 motor strength and sensation all extremities.  No gait instability. Skin:   Skin is warm, dry and intact. No rash noted. Psychiatric: Mood and affect are normal. Speech and behavior are normal.  ____________________________________________   LABS (all labs ordered are listed, but only abnormal results are displayed)  Labs Reviewed  BASIC METABOLIC PANEL - Abnormal; Notable for the following components:      Result Value   Calcium 8.3 (*)    Anion gap 3 (*)    All other components within normal limits  CBC - Abnormal; Notable for the following components:   RBC 3.61 (*)    Hemoglobin 10.6 (*)    HCT 32.3 (*)    All other components within normal limits  RESP PANEL BY RT-PCR (FLU A&B, COVID) ARPGX2  D-DIMER, QUANTITATIVE  POC URINE PREG, ED  TROPONIN I (HIGH SENSITIVITY)  TROPONIN I (HIGH SENSITIVITY)   ____________________________________________  EKG  ED ECG REPORT I, Gabrianna Fassnacht J, the attending physician, personally viewed and interpreted this ECG.   Date: 11/29/2021  EKG Time: 0455  Rate: 83  Rhythm: normal sinus rhythm  Axis: Normal  Intervals:none  ST&T Change: Nonspecific  ____________________________________________  RADIOLOGY I, Aleyah Balik J, personally viewed and evaluated these images (plain radiographs) as part of my medical decision making, as well as reviewing the written report by the radiologist.  ED MD interpretation: Pulmonary hyperinflation but otherwise negative; cervical spine x-rays pending  Official radiology report(s): DG Chest 2 View  Result Date: 11/29/2021 CLINICAL DATA:  25 year old female with shortness of breath. EXAM: CHEST - 2 VIEW COMPARISON:  08/21/2021 and earlier. FINDINGS: Large lung volumes appear increased from 2013, stable from earlier this year. Normal cardiac size and mediastinal contours. Visualized tracheal air column is within normal limits. Both lungs appear clear. No pneumothorax or pleural effusion. No osseous abnormality identified.  Negative visible bowel gas. IMPRESSION: Possible pulmonary  hyperinflation but otherwise Negative. No acute cardiopulmonary abnormality. Electronically Signed   By: Odessa Fleming M.D.   On: 11/29/2021 05:31    ____________________________________________   PROCEDURES  Procedure(s) performed (including Critical Care):  Procedures   ____________________________________________   INITIAL IMPRESSION / ASSESSMENT AND PLAN / ED COURSE  As part of my medical decision making, I reviewed the following data within the electronic MEDICAL RECORD NUMBER Nursing notes reviewed and incorporated, Labs reviewed, EKG interpreted, Old chart reviewed, Radiograph reviewed, and Notes from prior ED visits     25 year old female presenting with shortness of breath. Differential includes, but is not limited to, viral syndrome, bronchitis including COPD exacerbation, pneumonia, reactive airway disease including asthma, CHF including exacerbation with or without pulmonary/interstitial edema, pneumothorax, ACS, thoracic trauma, and pulmonary embolism.   Initial cardiac panel unremarkable.  Will repeat troponin, check D-dimer, respiratory panel.  Patient has been told previously that she snores so there may be an element of OSA.  Additionally, chest x-ray demonstrates pulmonary hyperinflation; patient may benefit from albuterol MDI.  We will also obtain cervical spine x-rays for probable radiculopathy.  We will reassess.  Clinical Course as of 11/29/21 4920  Wed Nov 29, 2021  1007 Patient awaiting results of x-rays, repeat troponin and respiratory panel.  Care will be transferred to the oncoming provider.  Anticipate discharge home if normal.  Will discharge home with prescription for albuterol MDI and pulmonology referral for outpatient follow-up.  Strict return precautions given.  Patient verbalizes understanding agrees with plan of care. [JS]    Clinical Course User Index [JS] Irean Hong, MD     ____________________________________________   FINAL CLINICAL IMPRESSION(S) /  ED DIAGNOSES  Final diagnoses:  Shortness of breath  Cervical radiculopathy     ED Discharge Orders          Ordered    albuterol (VENTOLIN HFA) 108 (90 Base) MCG/ACT inhaler  Every 4 hours PRN        11/29/21 0614             Note:  This document was prepared using Dragon voice recognition software and may include unintentional dictation errors.    Irean Hong, MD 11/29/21 [phone]

## 2021-11-29 NOTE — ED Notes (Signed)
Pt refused repeat troponin  

## 2022-09-24 IMAGING — CR DG CHEST 2V
1 series · 2 of 2 positions shown · non-contrast
Comparison: 08/21/2021 and earlier.

CLINICAL DATA: 25-year-old female with shortness of breath.

EXAM:
CHEST - 2 VIEW

[Series 1: dg chest 2 view · 0.14mm/px · 2 of 2 slices shown]
[im 1/2]
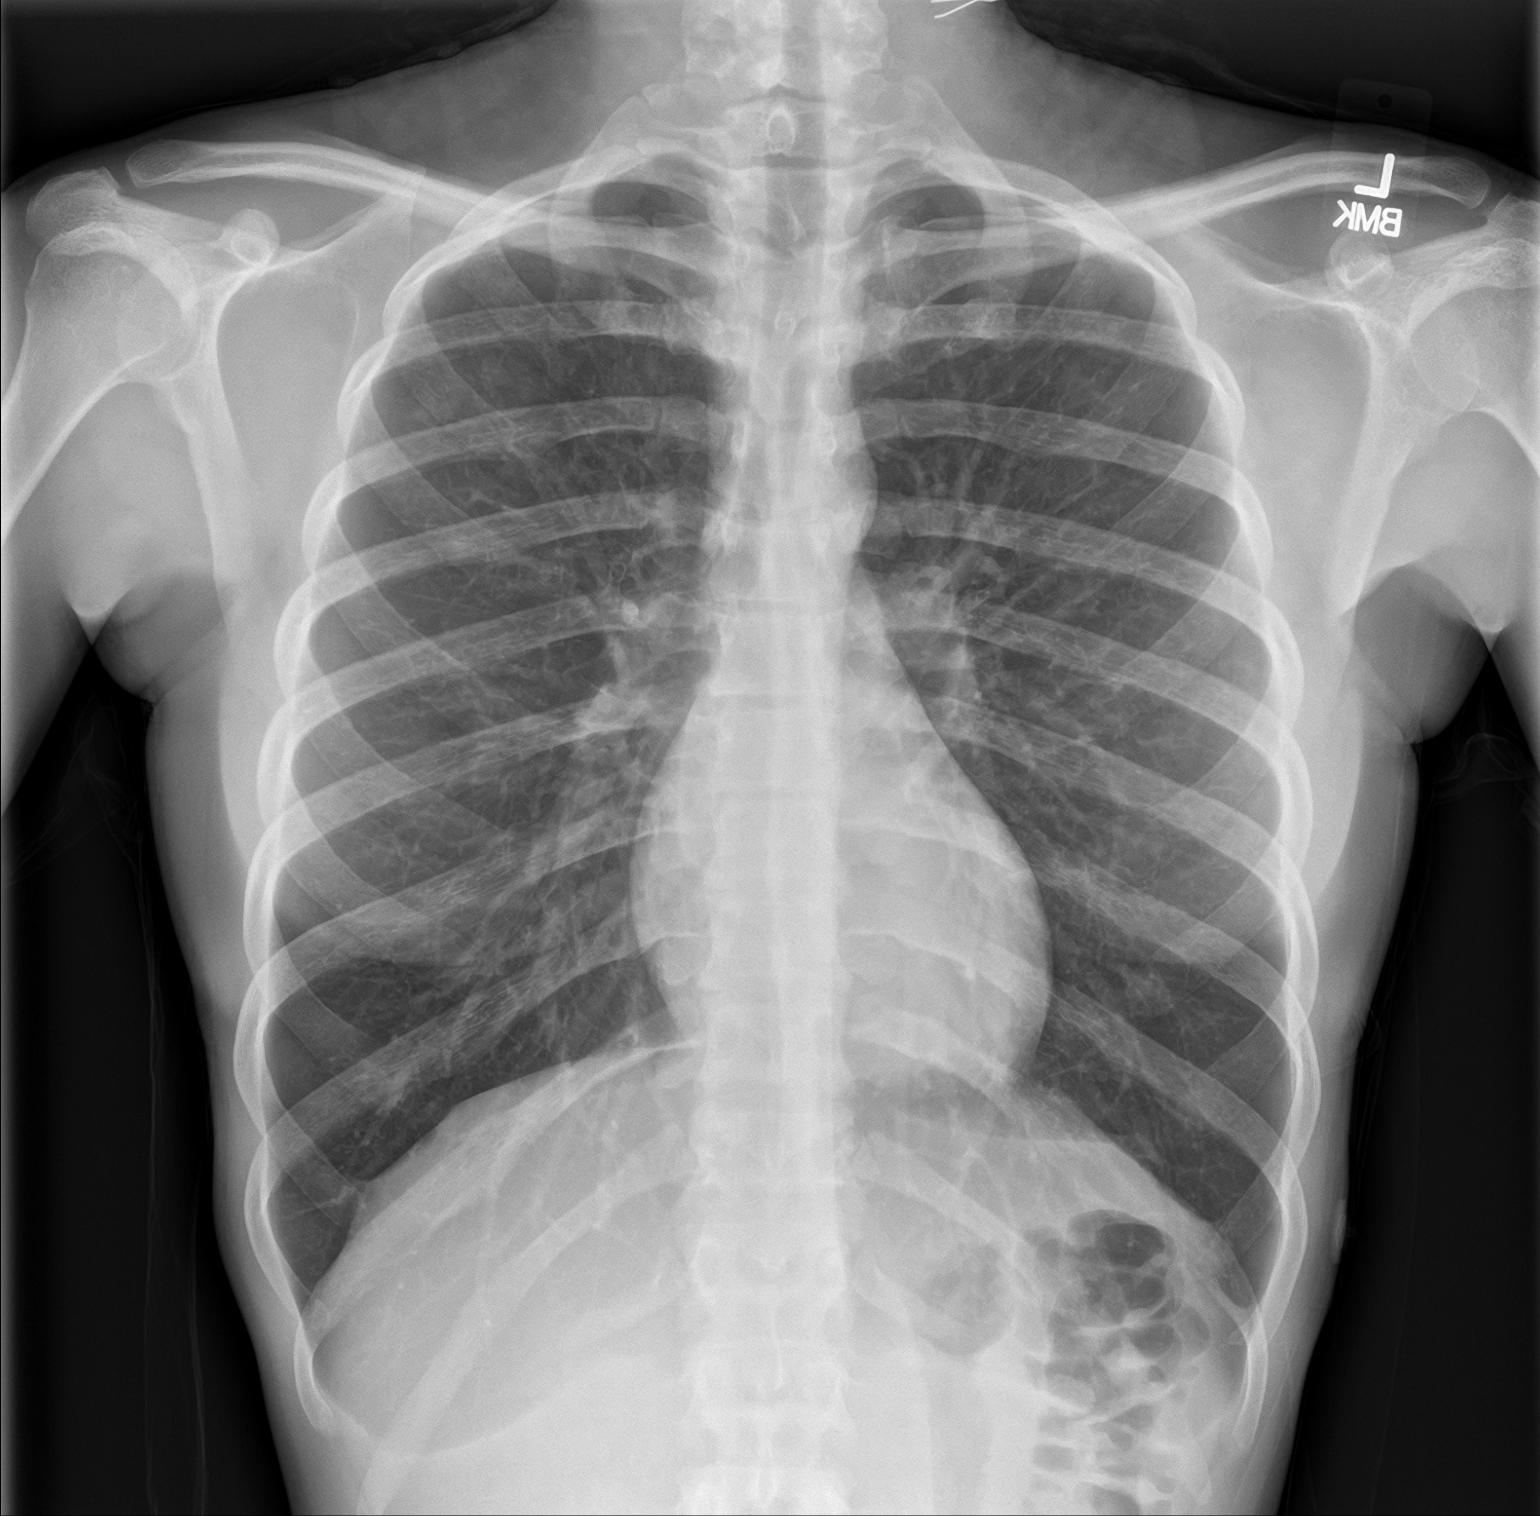
[im 2/2]
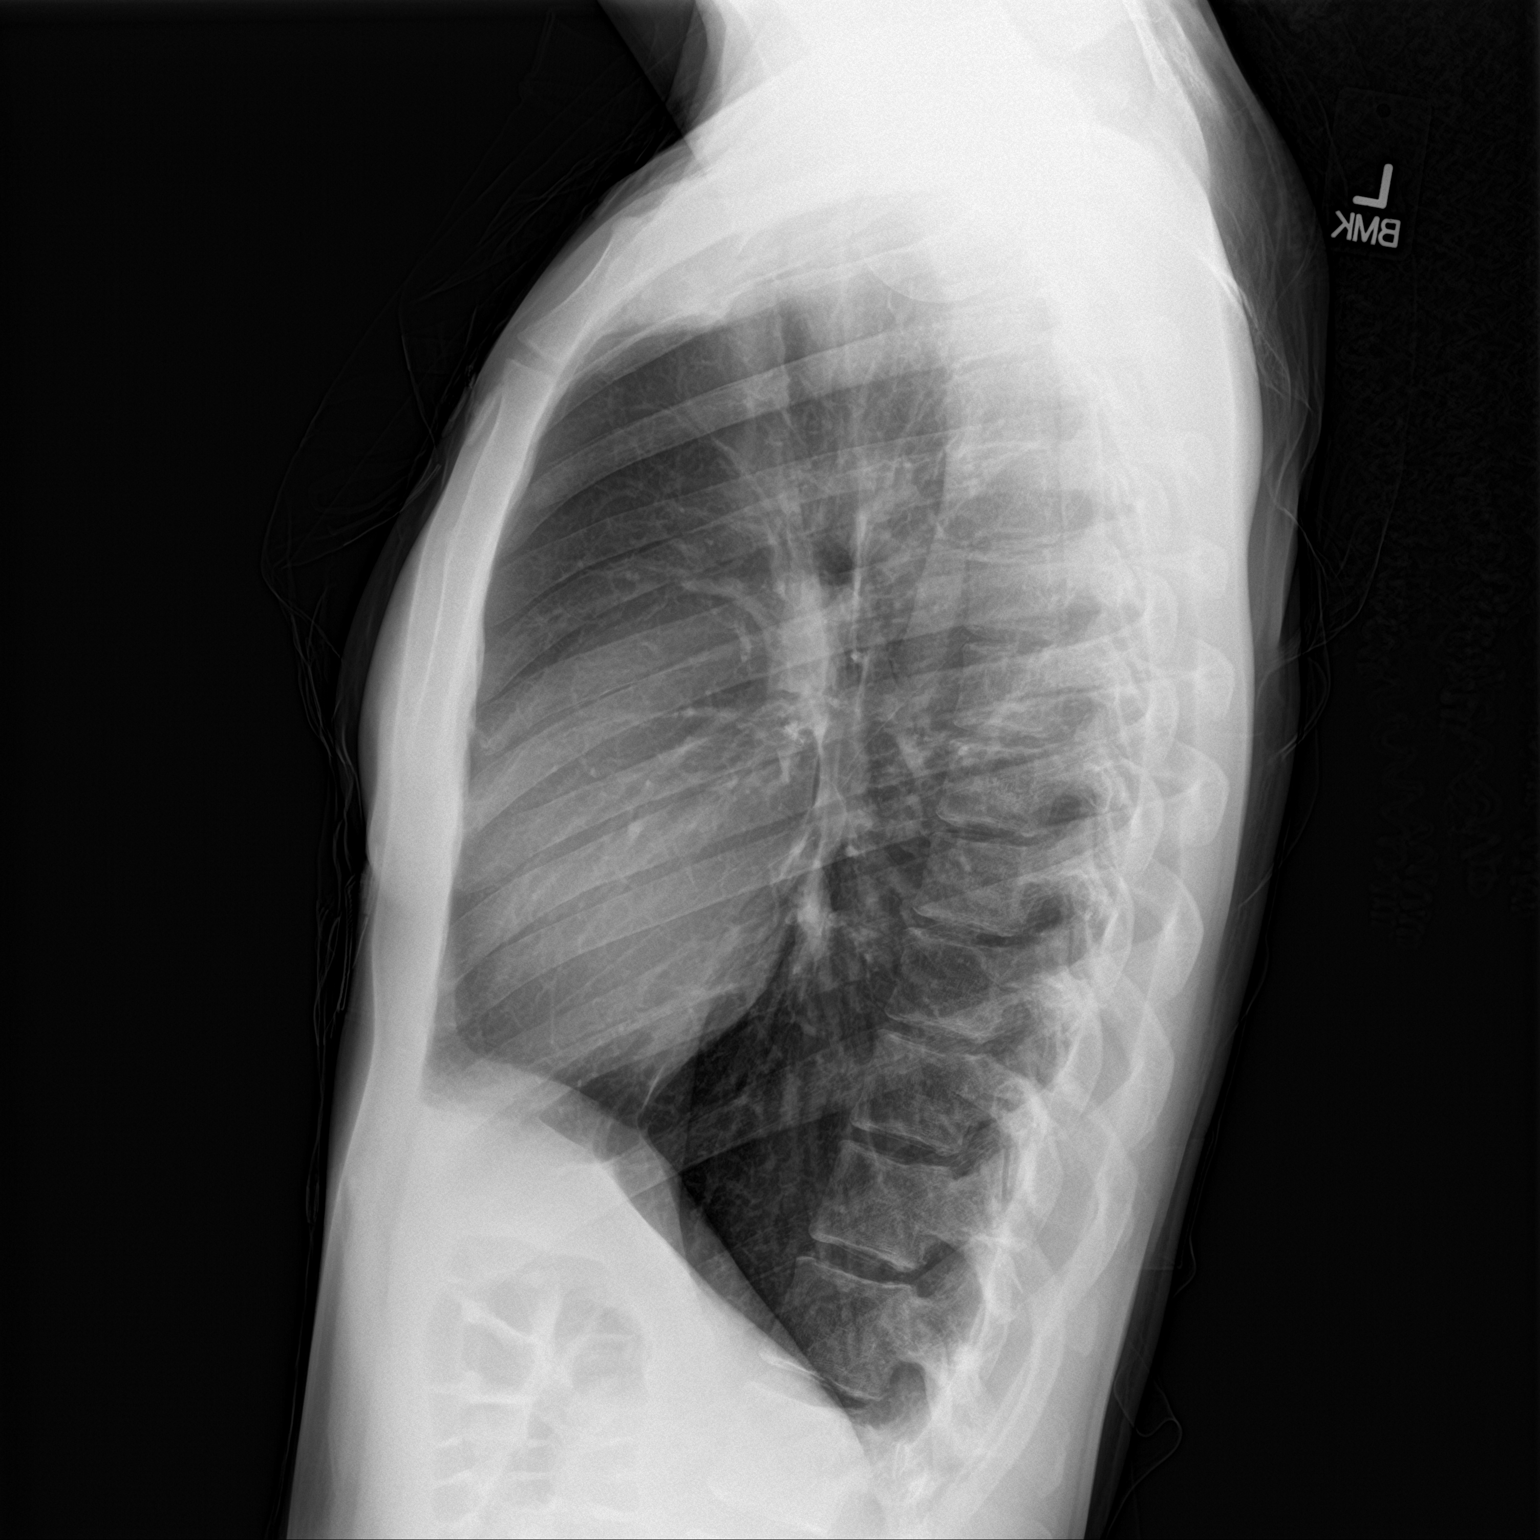

[2 of 2 positions shown; findings below may reference images not displayed]

FINDINGS: Large lung volumes appear increased from 7563, stable from earlier
this year. Normal cardiac size and mediastinal contours. Visualized
tracheal air column is within normal limits. Both lungs appear
clear. No pneumothorax or pleural effusion.

No osseous abnormality identified.  Negative visible bowel gas.
IMPRESSION: Possible pulmonary hyperinflation but otherwise Negative. No acute
cardiopulmonary abnormality.
